# Patient Record
Sex: Female | Born: 1975 | Race: Black or African American | Hispanic: No | Marital: Single
Health system: Southern US, Academic
[De-identification: ages and names within clinical notes are randomized; demographics above are authoritative.]

## PROBLEM LIST (undated history)

## (undated) ENCOUNTER — Ambulatory Visit: Attending: Family | Primary: Family

## (undated) ENCOUNTER — Encounter

## (undated) ENCOUNTER — Ambulatory Visit

## (undated) ENCOUNTER — Non-Acute Institutional Stay: Payer: PRIVATE HEALTH INSURANCE | Attending: Dermatology | Primary: Dermatology

## (undated) ENCOUNTER — Encounter: Attending: Adult Health | Primary: Adult Health

## (undated) ENCOUNTER — Ambulatory Visit: Attending: Adult Health | Primary: Adult Health

## (undated) ENCOUNTER — Telehealth

## (undated) ENCOUNTER — Ambulatory Visit: Payer: PRIVATE HEALTH INSURANCE

## (undated) ENCOUNTER — Non-Acute Institutional Stay: Payer: PRIVATE HEALTH INSURANCE | Attending: Hematology & Oncology | Primary: Hematology & Oncology

## (undated) ENCOUNTER — Encounter: Payer: PRIVATE HEALTH INSURANCE | Attending: Hematology & Oncology | Primary: Hematology & Oncology

## (undated) DIAGNOSIS — L732 Hidradenitis suppurativa: Secondary | ICD-10-CM

## (undated) DIAGNOSIS — M47817 Spondylosis without myelopathy or radiculopathy, lumbosacral region: Secondary | ICD-10-CM

## (undated) DIAGNOSIS — M51369 Other intervertebral disc degeneration, lumbar region without mention of lumbar back pain or lower extremity pain: Secondary | ICD-10-CM

## (undated) DIAGNOSIS — D649 Anemia, unspecified: Secondary | ICD-10-CM

## (undated) DIAGNOSIS — E119 Type 2 diabetes mellitus without complications: Secondary | ICD-10-CM

## (undated) DIAGNOSIS — I10 Essential (primary) hypertension: Secondary | ICD-10-CM

## (undated) DIAGNOSIS — N920 Excessive and frequent menstruation with regular cycle: Secondary | ICD-10-CM

## (undated) HISTORY — DX: Anemia, unspecified: D64.9

## (undated) HISTORY — PX: ABDOMINAL SURGERY: SHX537

## (undated) HISTORY — PX: TUBAL LIGATION: SHX77

## (undated) HISTORY — DX: Type 2 diabetes mellitus without complications: E11.9

## (undated) HISTORY — PX: SMALL INTESTINE SURGERY: SHX150

## (undated) HISTORY — PX: OTHER SURGICAL HISTORY: SHX169

## (undated) HISTORY — PX: CHOLECYSTECTOMY: SHX55

---

## 2004-08-19 ENCOUNTER — Emergency Department: Payer: Self-pay | Admitting: Unknown Physician Specialty

## 2005-01-07 ENCOUNTER — Emergency Department: Payer: Self-pay | Admitting: Emergency Medicine

## 2005-02-26 ENCOUNTER — Emergency Department: Payer: Self-pay | Admitting: Emergency Medicine

## 2007-04-09 ENCOUNTER — Emergency Department: Payer: Self-pay | Admitting: Emergency Medicine

## 2007-10-12 ENCOUNTER — Emergency Department: Payer: Self-pay | Admitting: Emergency Medicine

## 2007-10-24 ENCOUNTER — Emergency Department: Payer: Self-pay | Admitting: Emergency Medicine

## 2007-11-19 ENCOUNTER — Ambulatory Visit: Payer: Self-pay

## 2007-11-22 ENCOUNTER — Ambulatory Visit: Payer: Self-pay

## 2008-04-16 ENCOUNTER — Observation Stay: Payer: Self-pay

## 2008-05-15 ENCOUNTER — Ambulatory Visit: Payer: Self-pay | Admitting: Obstetrics and Gynecology

## 2008-05-16 ENCOUNTER — Inpatient Hospital Stay: Payer: Self-pay | Admitting: Obstetrics and Gynecology

## 2009-01-15 ENCOUNTER — Emergency Department: Payer: Self-pay | Admitting: Emergency Medicine

## 2009-06-23 HISTORY — PX: BREAST CYST ASPIRATION: SHX578

## 2009-11-20 ENCOUNTER — Ambulatory Visit: Payer: Self-pay

## 2010-01-15 ENCOUNTER — Ambulatory Visit: Payer: Self-pay | Admitting: General Surgery

## 2010-01-16 ENCOUNTER — Ambulatory Visit: Payer: Self-pay | Admitting: General Surgery

## 2010-01-17 ENCOUNTER — Ambulatory Visit: Payer: Self-pay | Admitting: General Surgery

## 2010-02-28 ENCOUNTER — Emergency Department: Payer: Self-pay | Admitting: Emergency Medicine

## 2011-09-02 ENCOUNTER — Emergency Department: Payer: Self-pay | Admitting: Emergency Medicine

## 2011-09-05 LAB — BETA STREP CULTURE(ARMC)

## 2015-09-12 ENCOUNTER — Encounter: Payer: Self-pay | Admitting: Emergency Medicine

## 2015-09-12 ENCOUNTER — Emergency Department
Admission: EM | Admit: 2015-09-12 | Discharge: 2015-09-12 | Disposition: A | Discharge status: Home / Self Care | Payer: No Typology Code available for payment source | Attending: Emergency Medicine | Admitting: Emergency Medicine

## 2015-09-12 ENCOUNTER — Emergency Department: Payer: No Typology Code available for payment source

## 2015-09-12 DIAGNOSIS — M25512 Pain in left shoulder: Secondary | ICD-10-CM | POA: Diagnosis present

## 2015-09-12 DIAGNOSIS — M79605 Pain in left leg: Secondary | ICD-10-CM | POA: Insufficient documentation

## 2015-09-12 DIAGNOSIS — R0789 Other chest pain: Secondary | ICD-10-CM | POA: Diagnosis not present

## 2015-09-12 DIAGNOSIS — Y92410 Unspecified street and highway as the place of occurrence of the external cause: Secondary | ICD-10-CM | POA: Diagnosis not present

## 2015-09-12 DIAGNOSIS — Y9389 Activity, other specified: Secondary | ICD-10-CM | POA: Diagnosis not present

## 2015-09-12 DIAGNOSIS — Y999 Unspecified external cause status: Secondary | ICD-10-CM | POA: Insufficient documentation

## 2015-09-12 MED ORDER — IBUPROFEN 800 MG PO TABS: 800.0000 mg | ORAL_TABLET | Freq: Three times a day (TID) | ORAL | Status: DC | PRN

## 2015-09-12 MED ORDER — TRAMADOL HCL 50 MG PO TABS
50.0000 mg | ORAL_TABLET | Freq: Once | ORAL | Status: AC
Start: 2015-09-12 — End: 2015-09-12
  Administered 2015-09-12: 50 mg via ORAL
  Filled 2015-09-12: qty 1

## 2015-09-12 MED ORDER — METHOCARBAMOL 500 MG PO TABS
1000.0000 mg | ORAL_TABLET | Freq: Once | ORAL | Status: AC
  Administered 2015-09-12: 1000 mg via ORAL
  Filled 2015-09-12: qty 2

## 2015-09-12 MED ORDER — METHOCARBAMOL 750 MG PO TABS: 750.0000 mg | ORAL_TABLET | Freq: Four times a day (QID) | ORAL | Status: DC

## 2015-09-12 MED ORDER — IBUPROFEN 800 MG PO TABS
800.0000 mg | ORAL_TABLET | Freq: Once | ORAL | Status: AC
  Administered 2015-09-12: 800 mg via ORAL
  Filled 2015-09-12: qty 1

## 2015-09-12 MED ORDER — OXYCODONE-ACETAMINOPHEN 5-325 MG PO TABS: 1.0000 | ORAL_TABLET | Freq: Four times a day (QID) | ORAL | Status: DC | PRN
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
## 2015-09-12 NOTE — Discharge Instructions (Signed)

## 2015-09-13 ENCOUNTER — Emergency Department
Admission: EM | Admit: 2015-09-13 | Discharge: 2015-09-13 | Disposition: A | Discharge status: Home / Self Care | Payer: No Typology Code available for payment source | Attending: Emergency Medicine | Admitting: Emergency Medicine

## 2015-09-13 ENCOUNTER — Emergency Department: Payer: No Typology Code available for payment source

## 2015-09-13 ENCOUNTER — Encounter: Payer: Self-pay | Admitting: Emergency Medicine

## 2015-09-13 DIAGNOSIS — S161XXA Strain of muscle, fascia and tendon at neck level, initial encounter: Secondary | ICD-10-CM | POA: Diagnosis not present

## 2015-09-13 DIAGNOSIS — Y92410 Unspecified street and highway as the place of occurrence of the external cause: Secondary | ICD-10-CM | POA: Insufficient documentation

## 2015-09-13 DIAGNOSIS — Y9389 Activity, other specified: Secondary | ICD-10-CM | POA: Insufficient documentation

## 2015-09-13 DIAGNOSIS — L989 Disorder of the skin and subcutaneous tissue, unspecified: Secondary | ICD-10-CM

## 2015-09-13 DIAGNOSIS — M542 Cervicalgia: Secondary | ICD-10-CM | POA: Diagnosis present

## 2015-09-13 DIAGNOSIS — Y999 Unspecified external cause status: Secondary | ICD-10-CM | POA: Insufficient documentation

## 2015-09-13 MED ORDER — KETOROLAC TROMETHAMINE 60 MG/2ML IM SOLN
60.0000 mg | Freq: Once | INTRAMUSCULAR | Status: AC
  Administered 2015-09-13: 60 mg via INTRAMUSCULAR
  Filled 2015-09-13: qty 2

## 2015-09-13 MED ORDER — SULFAMETHOXAZOLE-TRIMETHOPRIM 800-160 MG PO TABS: 1.0000 | ORAL_TABLET | Freq: Two times a day (BID) | ORAL | Status: DC

## 2015-09-13 NOTE — Discharge Instructions (Signed)
Cervical Strain and Sprain With Rehab  Cervical strain and sprain are injuries that commonly occur with "whiplash" injuries. Whiplash occurs when the neck is forcefully whipped backward or forward, such as during a motor vehicle accident or during contact sports. The muscles, ligaments, tendons, discs, and nerves of the neck are susceptible to injury when this occurs.  RISK FACTORS  Risk of having a whiplash injury increases if:  · Osteoarthritis of the spine.  · Situations that make head or neck accidents or trauma more likely.  · High-risk sports (football, rugby, wrestling, hockey, auto racing, gymnastics, diving, contact karate, or boxing).  · Poor strength and flexibility of the neck.  · Previous neck injury.  · Poor tackling technique.  · Improperly fitted or padded equipment.  SYMPTOMS   · Pain or stiffness in the front or back of neck or both.  · Symptoms may present immediately or up to 24 hours after injury.  · Dizziness, headache, nausea, and vomiting.  · Muscle spasm with soreness and stiffness in the neck.  · Tenderness and swelling at the injury site.  PREVENTION  · Learn and use proper technique (avoid tackling with the head, spearing, and head-butting; use proper falling techniques to avoid landing on the head).  · Warm up and stretch properly before activity.  · Maintain physical fitness:    Strength, flexibility, and endurance.    Cardiovascular fitness.  · Wear properly fitted and padded protective equipment, such as padded soft collars, for participation in contact sports.  PROGNOSIS   Recovery from cervical strain and sprain injuries is dependent on the extent of the injury. These injuries are usually curable in 1 week to 3 months with appropriate treatment.   RELATED COMPLICATIONS   · Temporary numbness and weakness may occur if the nerve roots are damaged, and this may persist until the nerve has completely healed.  · Chronic pain due to frequent recurrence of symptoms.  · Prolonged healing,  especially if activity is resumed too soon (before complete recovery).  TREATMENT   Treatment initially involves the use of ice and medication to help reduce pain and inflammation. It is also important to perform strengthening and stretching exercises and modify activities that worsen symptoms so the injury does not get worse. These exercises may be performed at home or with a therapist. For patients who experience severe symptoms, a soft, padded collar may be recommended to be worn around the neck.   Improving your posture may help reduce symptoms. Posture improvement includes pulling your chin and abdomen in while sitting or standing. If you are sitting, sit in a firm chair with your buttocks against the back of the chair. While sleeping, try replacing your pillow with a small towel rolled to 2 inches in diameter, or use a cervical pillow or soft cervical collar. Poor sleeping positions delay healing.   For patients with nerve root damage, which causes numbness or weakness, the use of a cervical traction apparatus may be recommended. Surgery is rarely necessary for these injuries. However, cervical strain and sprains that are present at birth (congenital) may require surgery.  MEDICATION   · If pain medication is necessary, nonsteroidal anti-inflammatory medications, such as aspirin and ibuprofen, or other minor pain relievers, such as acetaminophen, are often recommended.  · Do not take pain medication for 7 days before surgery.  · Prescription pain relievers may be given if deemed necessary by your caregiver. Use only as directed and only as much as you need.    HEAT AND COLD:   · Cold treatment (icing) relieves pain and reduces inflammation. Cold treatment should be applied for 10 to 15 minutes every 2 to 3 hours for inflammation and pain and immediately after any activity that aggravates your symptoms. Use ice packs or an ice massage.  · Heat treatment may be used prior to performing the stretching and  strengthening activities prescribed by your caregiver, physical therapist, or athletic trainer. Use a heat pack or a warm soak.  SEEK MEDICAL CARE IF:   · Symptoms get worse or do not improve in 2 weeks despite treatment.  · New, unexplained symptoms develop (drugs used in treatment may produce side effects).  EXERCISES  RANGE OF MOTION (ROM) AND STRETCHING EXERCISES - Cervical Strain and Sprain  These exercises may help you when beginning to rehabilitate your injury. In order to successfully resolve your symptoms, you must improve your posture. These exercises are designed to help reduce the forward-head and rounded-shoulder posture which contributes to this condition. Your symptoms may resolve with or without further involvement from your physician, physical therapist or athletic trainer. While completing these exercises, remember:   · Restoring tissue flexibility helps normal motion to return to the joints. This allows healthier, less painful movement and activity.  · An effective stretch should be held for at least 20 seconds, although you may need to begin with shorter hold times for comfort.  · A stretch should never be painful. You should only feel a gentle lengthening or release in the stretched tissue.  STRETCH- Axial Extensors  · Lie on your back on the floor. You may bend your knees for comfort. Place a rolled-up hand towel or dish towel, about 2 inches in diameter, under the part of your head that makes contact with the floor.  · Gently tuck your chin, as if trying to make a "double chin," until you feel a gentle stretch at the base of your head.  · Hold __________ seconds.  Repeat __________ times. Complete this exercise __________ times per day.   STRETCH - Axial Extension   · Stand or sit on a firm surface. Assume a good posture: chest up, shoulders drawn back, abdominal muscles slightly tense, knees unlocked (if standing) and feet hip width apart.  · Slowly retract your chin so your head slides back  and your chin slightly lowers. Continue to look straight ahead.  · You should feel a gentle stretch in the back of your head. Be certain not to feel an aggressive stretch since this can cause headaches later.  · Hold for __________ seconds.  Repeat __________ times. Complete this exercise __________ times per day.  STRETCH - Cervical Side Bend   · Stand or sit on a firm surface. Assume a good posture: chest up, shoulders drawn back, abdominal muscles slightly tense, knees unlocked (if standing) and feet hip width apart.  · Without letting your nose or shoulders move, slowly tip your right / left ear to your shoulder until your feel a gentle stretch in the muscles on the opposite side of your neck.  · Hold __________ seconds.  Repeat __________ times. Complete this exercise __________ times per day.  STRETCH - Cervical Rotators   · Stand or sit on a firm surface. Assume a good posture: chest up, shoulders drawn back, abdominal muscles slightly tense, knees unlocked (if standing) and feet hip width apart.  · Keeping your eyes level with the ground, slowly turn your head until you feel a gentle stretch along   the back and opposite side of your neck.  · Hold __________ seconds.  Repeat __________ times. Complete this exercise __________ times per day.  RANGE OF MOTION - Neck Circles   · Stand or sit on a firm surface. Assume a good posture: chest up, shoulders drawn back, abdominal muscles slightly tense, knees unlocked (if standing) and feet hip width apart.  · Gently roll your head down and around from the back of one shoulder to the back of the other. The motion should never be forced or painful.  · Repeat the motion 10-20 times, or until you feel the neck muscles relax and loosen.  Repeat __________ times. Complete the exercise __________ times per day.  STRENGTHENING EXERCISES - Cervical Strain and Sprain  These exercises may help you when beginning to rehabilitate your injury. They may resolve your symptoms with or  without further involvement from your physician, physical therapist, or athletic trainer. While completing these exercises, remember:   · Muscles can gain both the endurance and the strength needed for everyday activities through controlled exercises.  · Complete these exercises as instructed by your physician, physical therapist, or athletic trainer. Progress the resistance and repetitions only as guided.  · You may experience muscle soreness or fatigue, but the pain or discomfort you are trying to eliminate should never worsen during these exercises. If this pain does worsen, stop and make certain you are following the directions exactly. If the pain is still present after adjustments, discontinue the exercise until you can discuss the trouble with your clinician.  STRENGTH - Cervical Flexors, Isometric  · Face a wall, standing about 6 inches away. Place a small pillow, a ball about 6-8 inches in diameter, or a folded towel between your forehead and the wall.  · Slightly tuck your chin and gently push your forehead into the soft object. Push only with mild to moderate intensity, building up tension gradually. Keep your jaw and forehead relaxed.  · Hold 10 to 20 seconds. Keep your breathing relaxed.  · Release the tension slowly. Relax your neck muscles completely before you start the next repetition.  Repeat __________ times. Complete this exercise __________ times per day.  STRENGTH- Cervical Lateral Flexors, Isometric   · Stand about 6 inches away from a wall. Place a small pillow, a ball about 6-8 inches in diameter, or a folded towel between the side of your head and the wall.  · Slightly tuck your chin and gently tilt your head into the soft object. Push only with mild to moderate intensity, building up tension gradually. Keep your jaw and forehead relaxed.  · Hold 10 to 20 seconds. Keep your breathing relaxed.  · Release the tension slowly. Relax your neck muscles completely before you start the next  repetition.  Repeat __________ times. Complete this exercise __________ times per day.  STRENGTH - Cervical Extensors, Isometric   · Stand about 6 inches away from a wall. Place a small pillow, a ball about 6-8 inches in diameter, or a folded towel between the back of your head and the wall.  · Slightly tuck your chin and gently tilt your head back into the soft object. Push only with mild to moderate intensity, building up tension gradually. Keep your jaw and forehead relaxed.  · Hold 10 to 20 seconds. Keep your breathing relaxed.  · Release the tension slowly. Relax your neck muscles completely before you start the next repetition.  Repeat __________ times. Complete this exercise __________ times per day.    All of your joints have less wear and tear when properly supported by a spine with good posture. This means you will experience a healthier, less painful body.  Correct posture must be practiced with all of your activities, especially prolonged sitting and standing. Correct posture is as important when doing repetitive low-stress activities (typing) as it is when doing a single heavy-load activity (lifting). PROLONGED STANDING WHILE SLIGHTLY LEANING FORWARD When completing a task that requires you to lean forward while standing in one  place for a long time, place either foot up on a stationary 2- to 4-inch high object to help maintain the best posture. When both feet are on the ground, the low back tends to lose its slight inward curve. If this curve flattens (or becomes too large), then the back and your other joints will experience too much stress, fatigue more quickly, and can cause pain.  RESTING POSITIONS Consider which positions are most painful for you when choosing a resting position. If you have pain with flexion-based activities (sitting, bending, stooping, squatting), choose a position that allows you to rest in a less flexed posture. You would want to avoid curling into a fetal position on your side. If your pain worsens with extension-based activities (prolonged standing, working overhead), avoid resting in an extended position such as sleeping on your stomach. Most people will find more comfort when they rest with their spine in a more neutral position, neither too rounded nor too arched. Lying on a non-sagging bed on your side with a pillow between your knees, or on your back with a pillow under your knees will often provide some relief. Keep in mind, being in any one position for a prolonged period of time, no matter how correct your posture, can still lead to stiffness. WALKING Walk with an upright posture. Your ears, shoulders, and hips should all line up. OFFICE WORK When working at a desk, create an environment that supports good, upright posture. Without extra support, muscles fatigue and lead to excessive strain on joints and other tissues. CHAIR:  A chair should be able to slide under your desk when your back makes contact with the back of the chair. This allows you to work closely.  The chair's height should allow your eyes to be level with the upper part of your monitor and your hands to be slightly lower than your elbows.  Body position:  Your feet should make contact with the floor. If this is not  possible, use a foot rest.  Keep your ears over your shoulders. This will reduce stress on your neck and low back.   This information is not intended to replace advice given to you by your health care provider. Make sure you discuss any questions you have with your health care provider.   Document Released: 06/09/2005 Document Revised: 06/30/2014 Document Reviewed: 09/21/2008 [employer] Interactive Patient Education 2016 [employer]  Continue ibuprofen, muscle relaxants and Percocet for pain control. Begin exercises when pain improves. Apply warm compresses to area on scalp. Take antibiotic as directed. Return to the emergency room for any worsening symptoms.
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
## 2015-09-13 NOTE — ED Notes (Signed)
Pt discharged to home.  Family member driving.  Discharge instructions reviewed.  Verbalized understanding.  No questions or concerns at this time.  Teach back verified.  Pt in NAD.  No items left in ED.   

## 2016-08-05 DIAGNOSIS — M5136 Other intervertebral disc degeneration, lumbar region: Secondary | ICD-10-CM | POA: Insufficient documentation

## 2016-08-05 DIAGNOSIS — L732 Hidradenitis suppurativa: Secondary | ICD-10-CM | POA: Insufficient documentation

## 2016-08-05 DIAGNOSIS — M47817 Spondylosis without myelopathy or radiculopathy, lumbosacral region: Secondary | ICD-10-CM | POA: Insufficient documentation

## 2016-08-05 DIAGNOSIS — M51369 Other intervertebral disc degeneration, lumbar region without mention of lumbar back pain or lower extremity pain: Secondary | ICD-10-CM | POA: Insufficient documentation

## 2016-08-05 DIAGNOSIS — M25532 Pain in left wrist: Secondary | ICD-10-CM | POA: Insufficient documentation

## 2016-08-13 ENCOUNTER — Other Ambulatory Visit: Payer: Self-pay | Admitting: Orthopedic Surgery

## 2016-08-13 DIAGNOSIS — S63502A Unspecified sprain of left wrist, initial encounter: Secondary | ICD-10-CM

## 2016-08-13 DIAGNOSIS — M25532 Pain in left wrist: Secondary | ICD-10-CM

## 2016-08-13 DIAGNOSIS — G8929 Other chronic pain: Secondary | ICD-10-CM

## 2016-08-19 ENCOUNTER — Ambulatory Visit: Payer: BLUE CROSS/BLUE SHIELD

## 2016-11-19 ENCOUNTER — Encounter: Payer: Self-pay | Admitting: Emergency Medicine

## 2016-11-19 ENCOUNTER — Emergency Department
Admission: EM | Admit: 2016-11-19 | Discharge: 2016-11-19 | Disposition: A | Discharge status: Home / Self Care | Payer: BLUE CROSS/BLUE SHIELD | Attending: Emergency Medicine | Admitting: Emergency Medicine

## 2016-11-19 DIAGNOSIS — R1030 Lower abdominal pain, unspecified: Secondary | ICD-10-CM

## 2016-11-19 DIAGNOSIS — R1032 Left lower quadrant pain: Secondary | ICD-10-CM | POA: Insufficient documentation

## 2016-11-19 LAB — URINALYSIS, COMPLETE (UACMP) WITH MICROSCOPIC
Bacteria, UA: NONE SEEN
Bilirubin Urine: NEGATIVE
GLUCOSE, UA: NEGATIVE mg/dL
Ketones, ur: NEGATIVE mg/dL
Leukocytes, UA: NEGATIVE
Nitrite: NEGATIVE
PH: 6 (ref 5.0–8.0)
PROTEIN: NEGATIVE mg/dL
Specific Gravity, Urine: 1.016 (ref 1.005–1.030)

## 2016-11-19 LAB — COMPREHENSIVE METABOLIC PANEL
ALBUMIN: 3.9 g/dL (ref 3.5–5.0)
ALK PHOS: 111 U/L (ref 38–126)
ALT: 35 U/L (ref 14–54)
AST: 27 U/L (ref 15–41)
Anion gap: 7 (ref 5–15)
BUN: 10 mg/dL (ref 6–20)
CALCIUM: 8.9 mg/dL (ref 8.9–10.3)
CO2: 26 mmol/L (ref 22–32)
CREATININE: 0.77 mg/dL (ref 0.44–1.00)
Chloride: 105 mmol/L (ref 101–111)
GFR calc Af Amer: >60 mL/min (ref >60)
GFR calc non Af Amer: >60 mL/min (ref >60)
GLUCOSE: 145 mg/dL — AB (ref 65–99)
Potassium: 3.4 mmol/L — ABNORMAL LOW (ref 3.5–5.1)
SODIUM: 138 mmol/L (ref 135–145)
Total Bilirubin: 0.5 mg/dL (ref 0.3–1.2)
Total Protein: 8.7 g/dL — ABNORMAL HIGH (ref 6.5–8.1)

## 2016-11-19 LAB — CBC
HCT: 27.9 % — ABNORMAL LOW (ref 35.0–47.0)
Hemoglobin: 8.4 g/dL — ABNORMAL LOW (ref 12.0–16.0)
MCH: 17.2 pg — AB (ref 26.0–34.0)
MCHC: 30 g/dL — AB (ref 32.0–36.0)
MCV: 57.4 fL — ABNORMAL LOW (ref 80.0–100.0)
PLATELETS: 387 10*3/uL (ref 150–440)
RBC: 4.86 MIL/uL (ref 3.80–5.20)
RDW: 20.5 % — ABNORMAL HIGH (ref 11.5–14.5)
WBC: 9.6 10*3/uL (ref 3.6–11.0)

## 2016-11-19 LAB — LIPASE, BLOOD: Lipase: 35 U/L (ref 11–51)

## 2016-11-19 LAB — POCT PREGNANCY, URINE: Preg Test, Ur: NEGATIVE

## 2016-11-19 NOTE — ED Triage Notes (Signed)
Patient presents to the ED with left lower quadrant pain x 1 week.  Patient states her menstrual period is about 1-2 weeks late.  Patient has history of tubal ligation.  Patient is in no obvious distress at this time.  Denies any vaginal bleeding.  Denies any flank pain.

## 2016-11-19 NOTE — ED Provider Notes (Signed)
[facility] Emergency Department Provider Note  Time seen: 1:14 PM  I have reviewed the triage vital signs and the nursing notes.   HISTORY  Chief Complaint Abdominal Pain    HPI Shannon Escobar is a 41 y.o. female who presents to the emergency department with 1 week of intermittent lower abdominal cramping and being approximately 2 weeks late on her period. According to the patient for the past one week she has had mild left lower quadrant abdominal cramping. She states it felt like menstrual cramping better. Never started. States her periods are normally regular and it is very abnormal for her to go 6 weeks without a period. Denies any nausea, vomiting, diarrhea, hematuria or dysuria. Denies any vaginal discharge. Denies any fever. Denies any history of colitis, diverticulitis or ovarian cysts.  History reviewed. No pertinent past medical history.  There are no active problems to display for this patient.   Past Surgical History:  Procedure Laterality Date  . ABDOMINAL SURGERY    . CESAREAN SECTION     x2  . CHOLECYSTECTOMY    . TUBAL LIGATION      Prior to Admission medications   Medication Sig Start Date End Date Taking? Authorizing Provider  ibuprofen (ADVIL,MOTRIN) 800 MG tablet Take 1 tablet (800 mg total) by mouth every 8 (eight) hours as needed for moderate pain. 09/12/15   Joni Reining, PA-C  methocarbamol (ROBAXIN-750) 750 MG tablet Take 1 tablet (750 mg total) by mouth 4 (four) times daily. 09/12/15   Joni Reining, PA-C  oxyCODONE-acetaminophen (ROXICET) 5-325 MG tablet Take 1 tablet by mouth every 6 (six) hours as needed for moderate pain. 09/12/15   Joni Reining, PA-C  sulfamethoxazole-trimethoprim (BACTRIM DS,SEPTRA DS) 800-160 MG tablet Take 1 tablet by mouth 2 (two) times daily. 09/13/15   Ignacia Bayley, PA-C    Allergies  Allergen Reactions  . Aspirin   . Penicillins     No family history on file.  Social History Social  History  Substance Use Topics  . Smoking status: Never Smoker  . Smokeless tobacco: Never Used  . Alcohol use No    Review of Systems Constitutional: Negative for fever. Cardiovascular: Negative for chest pain. Respiratory: Negative for shortness of breath. Gastrointestinal: Left lower quadrant abdominal pain. Negative for nausea vomiting or diarrhea. Genitourinary: Negative for dysuria. Negative for vaginal discharge or bleeding. Musculoskeletal: Negative for back pain. Neurological: Negative for headache All other ROS negative  ____________________________________________   PHYSICAL EXAM:  VITAL SIGNS: ED Triage Vitals  Enc Vitals Group     BP 11/19/16 1117 (!) 142/82     Pulse Rate 11/19/16 1117 91     Resp 11/19/16 1117 15     Temp 11/19/16 1117 98.8 F (37.1 C)     Temp Source 11/19/16 1117 Oral     SpO2 11/19/16 1117 98 %     Weight 11/19/16 1118 220 lb (99.8 kg)     Height 11/19/16 1118 5\' 7"  (1.702 m)     Head Circumference --      Peak Flow --      Pain Score 11/19/16 1121 8     Pain Loc --      Pain Edu? --      Excl. in GC? --     Constitutional: Alert and oriented. Well appearing and in no distress. Eyes: Normal exam ENT   Head: Normocephalic and atraumatic.   Mouth/Throat: Mucous membranes are moist. Cardiovascular: Normal rate, regular  rhythm. No murmur Respiratory: Normal respiratory effort without tachypnea nor retractions. Breath sounds are clear  Gastrointestinal: Soft, slight left lower quadrant tenderness palpation, no reaction to palpation. No rebound or guarding. No distention. Abdomen otherwise benign. Musculoskeletal: Nontender with normal range of motion in all extremities.  Neurologic:  Normal speech and language. No gross focal neurologic deficits  Skin:  Skin is warm, dry and intact.  Psychiatric: Mood and affect are normal.   ____________________________________________   INITIAL IMPRESSION / ASSESSMENT AND PLAN / ED  COURSE  Pertinent labs & imaging results that were available during my care of the patient were reviewed by me and considered in my medical decision making (see chart for details).  The patient presents to the emergency department with mild left lower quadrant abdominal cramping for the past one week. Patient states the pain goes away when she takes ibuprofen. She states her main concern is not having a period over the past 6 weeks and making sure she is not pregnant although she states a tubal ligation 8 years ago. Patient's labs are normal including a negative pregnancy test. Normal white blood cell count. I discussed the options with the patient including going ahead and getting a CT scan done today to rule out colitis/diverticulitis. Patient states the discomfort is very minimal and she would prefer to wait to see if the discomfort resolves on its own. Does not want any pain medication, states ibuprofen has been sufficient for the cramping. With a normal workup will discharge the patient home with primary care follow-up. I discussed my normal abdominal pain return precautions with the patient.  ____________________________________________   FINAL CLINICAL IMPRESSION(S) / ED DIAGNOSES  Lower abdominal pain    Minna AntisPaduchowski, Edith Groleau, MD 11/19/16 1325

## 2016-11-19 NOTE — Discharge Instructions (Signed)
As we discussed please follow-up with your primary care doctor in the next several days for recheck/reevaluation. Return to the emergency department for any worsening abdominal pain, diarrhea or fever.

## 2017-02-11 ENCOUNTER — Other Ambulatory Visit: Payer: Self-pay | Admitting: Medical Oncology

## 2017-02-11 DIAGNOSIS — N63 Unspecified lump in unspecified breast: Secondary | ICD-10-CM

## 2017-02-18 ENCOUNTER — Inpatient Hospital Stay
Admission: RE | Admit: 2017-02-18 | Discharge: 2017-02-18 | Disposition: A | Discharge status: Home / Self Care | Payer: Self-pay | Source: Ambulatory Visit | Attending: *Deleted | Admitting: *Deleted

## 2017-02-18 ENCOUNTER — Other Ambulatory Visit: Payer: Self-pay | Admitting: *Deleted

## 2017-02-18 DIAGNOSIS — Z1231 Encounter for screening mammogram for malignant neoplasm of breast: Secondary | ICD-10-CM

## 2017-02-20 ENCOUNTER — Other Ambulatory Visit: Payer: Self-pay | Admitting: Medical Oncology

## 2017-02-20 ENCOUNTER — Ambulatory Visit
Admission: RE | Admit: 2017-02-20 | Discharge: 2017-02-20 | Disposition: A | Discharge status: Home / Self Care | Payer: Self-pay | Source: Ambulatory Visit | Attending: Medical Oncology | Admitting: Medical Oncology

## 2017-02-20 DIAGNOSIS — N63 Unspecified lump in unspecified breast: Secondary | ICD-10-CM

## 2017-02-20 DIAGNOSIS — N611 Abscess of the breast and nipple: Secondary | ICD-10-CM

## 2017-02-23 LAB — BODY FLUID CULTURE: Culture: NO GROWTH

## 2017-02-24 LAB — CYTOLOGY - NON PAP

## 2017-02-27 ENCOUNTER — Other Ambulatory Visit: Payer: Self-pay | Admitting: Medical Oncology

## 2017-02-27 ENCOUNTER — Ambulatory Visit
Admission: RE | Admit: 2017-02-27 | Discharge: 2017-02-27 | Disposition: A | Discharge status: Home / Self Care | Payer: PRIVATE HEALTH INSURANCE | Source: Ambulatory Visit | Attending: Medical Oncology | Admitting: Medical Oncology

## 2017-02-27 DIAGNOSIS — N611 Abscess of the breast and nipple: Secondary | ICD-10-CM | POA: Diagnosis present

## 2017-02-27 DIAGNOSIS — R928 Other abnormal and inconclusive findings on diagnostic imaging of breast: Secondary | ICD-10-CM

## 2017-03-06 ENCOUNTER — Ambulatory Visit
Admission: RE | Admit: 2017-03-06 | Discharge: 2017-03-06 | Disposition: A | Discharge status: Home / Self Care | Payer: No Typology Code available for payment source | Source: Ambulatory Visit | Attending: Medical Oncology | Admitting: Medical Oncology

## 2017-03-06 DIAGNOSIS — N611 Abscess of the breast and nipple: Secondary | ICD-10-CM | POA: Diagnosis not present

## 2017-03-06 DIAGNOSIS — R928 Other abnormal and inconclusive findings on diagnostic imaging of breast: Secondary | ICD-10-CM

## 2017-03-11 ENCOUNTER — Other Ambulatory Visit: Payer: Self-pay | Admitting: Medical Oncology

## 2017-03-11 DIAGNOSIS — N611 Abscess of the breast and nipple: Secondary | ICD-10-CM

## 2017-03-11 DIAGNOSIS — R928 Other abnormal and inconclusive findings on diagnostic imaging of breast: Secondary | ICD-10-CM

## 2017-03-13 ENCOUNTER — Ambulatory Visit
Admission: RE | Admit: 2017-03-13 | Discharge: 2017-03-13 | Disposition: A | Discharge status: Home / Self Care | Payer: No Typology Code available for payment source | Source: Ambulatory Visit | Attending: Medical Oncology | Admitting: Medical Oncology

## 2017-03-13 ENCOUNTER — Other Ambulatory Visit: Payer: Self-pay | Admitting: *Deleted

## 2017-03-13 ENCOUNTER — Ambulatory Visit
Admission: RE | Admit: 2017-03-13 | Discharge: 2017-03-13 | Disposition: A | Discharge status: Home / Self Care | Payer: Self-pay | Source: Ambulatory Visit | Attending: *Deleted | Admitting: *Deleted

## 2017-03-13 DIAGNOSIS — N611 Abscess of the breast and nipple: Secondary | ICD-10-CM

## 2017-03-13 DIAGNOSIS — Z9889 Other specified postprocedural states: Secondary | ICD-10-CM | POA: Diagnosis not present

## 2017-03-13 DIAGNOSIS — R928 Other abnormal and inconclusive findings on diagnostic imaging of breast: Secondary | ICD-10-CM

## 2017-03-13 DIAGNOSIS — Z9289 Personal history of other medical treatment: Secondary | ICD-10-CM

## 2017-03-18 ENCOUNTER — Other Ambulatory Visit: Payer: Self-pay | Admitting: Medical Oncology

## 2017-03-18 DIAGNOSIS — N611 Abscess of the breast and nipple: Secondary | ICD-10-CM

## 2017-03-18 DIAGNOSIS — R928 Other abnormal and inconclusive findings on diagnostic imaging of breast: Secondary | ICD-10-CM

## 2017-03-20 ENCOUNTER — Other Ambulatory Visit: Payer: BLUE CROSS/BLUE SHIELD

## 2017-03-20 ENCOUNTER — Ambulatory Visit
Admission: RE | Admit: 2017-03-20 | Discharge: 2017-03-20 | Disposition: A | Discharge status: Home / Self Care | Payer: No Typology Code available for payment source | Source: Ambulatory Visit | Attending: Medical Oncology | Admitting: Medical Oncology

## 2017-03-20 DIAGNOSIS — N611 Abscess of the breast and nipple: Secondary | ICD-10-CM | POA: Diagnosis not present

## 2017-03-20 DIAGNOSIS — R928 Other abnormal and inconclusive findings on diagnostic imaging of breast: Secondary | ICD-10-CM

## 2017-03-25 ENCOUNTER — Other Ambulatory Visit: Payer: Self-pay | Admitting: Medical Oncology

## 2017-03-25 DIAGNOSIS — R928 Other abnormal and inconclusive findings on diagnostic imaging of breast: Secondary | ICD-10-CM

## 2017-03-25 DIAGNOSIS — N611 Abscess of the breast and nipple: Secondary | ICD-10-CM

## 2017-04-03 ENCOUNTER — Ambulatory Visit: Payer: No Typology Code available for payment source

## 2017-04-20 ENCOUNTER — Emergency Department: Payer: Self-pay

## 2017-04-20 ENCOUNTER — Encounter: Payer: Self-pay | Admitting: Emergency Medicine

## 2017-04-20 ENCOUNTER — Emergency Department
Admission: EM | Admit: 2017-04-20 | Discharge: 2017-04-20 | Disposition: A | Discharge status: Home / Self Care | Payer: Self-pay | Attending: Emergency Medicine | Admitting: Emergency Medicine

## 2017-04-20 DIAGNOSIS — L03031 Cellulitis of right toe: Secondary | ICD-10-CM | POA: Insufficient documentation

## 2017-04-20 DIAGNOSIS — Y939 Activity, unspecified: Secondary | ICD-10-CM | POA: Insufficient documentation

## 2017-04-20 DIAGNOSIS — Y929 Unspecified place or not applicable: Secondary | ICD-10-CM | POA: Insufficient documentation

## 2017-04-20 DIAGNOSIS — Y999 Unspecified external cause status: Secondary | ICD-10-CM | POA: Insufficient documentation

## 2017-04-20 DIAGNOSIS — Z79899 Other long term (current) drug therapy: Secondary | ICD-10-CM | POA: Insufficient documentation

## 2017-04-20 DIAGNOSIS — W19XXXA Unspecified fall, initial encounter: Secondary | ICD-10-CM | POA: Insufficient documentation

## 2017-04-20 DIAGNOSIS — S90111A Contusion of right great toe without damage to nail, initial encounter: Secondary | ICD-10-CM | POA: Insufficient documentation

## 2017-04-20 MED ORDER — TRAMADOL HCL 50 MG PO TABS: 50.0000 mg | ORAL_TABLET | Freq: Four times a day (QID) | ORAL | 0 refills | Status: AC | PRN

## 2017-04-20 MED ORDER — CEPHALEXIN 500 MG PO CAPS: 500.0000 mg | ORAL_CAPSULE | Freq: Two times a day (BID) | ORAL | 0 refills | Status: AC

## 2017-04-20 MED ORDER — CEPHALEXIN 500 MG PO CAPS
500.0000 mg | ORAL_CAPSULE | Freq: Once | ORAL | Status: AC
  Administered 2017-04-20: 500 mg via ORAL
  Filled 2017-04-20: qty 1

## 2017-04-20 NOTE — ED Triage Notes (Signed)
Toe injury x1 day , great toe & 4th digit , drsg intact

## 2017-04-20 NOTE — ED Notes (Signed)
Pt to ed with c/o right foot first and fourth toe pain. Pt states she tripped on steps.  First toe with bleeding and abrasion, fourth toe bluish in color and swollen.  Pt reports increased pain with ambulation.  Pt reports last tetanus was 2 months ago.

## 2017-04-20 NOTE — ED Provider Notes (Signed)
[facility] Emergency Department Provider Note   First MD Initiated Contact with Patient 04/20/17 0915     (approximate)  I have reviewed the triage vital signs and the nursing notes.   HISTORY  Chief Complaint Toe Injury    HPI Gwenith Spitziffany L Aundria RudRogers is a 41 y.o. female resents with 8 out of 10 right great toe and fourth toe pain and abrasions status post accidental fall yesterday. Patient concerned about possible infection that she is noted what appears to be. No drainage from both toes since the injury. Patient states that she was going down her steps at her home where she actually had a fall resulting in injury.    Past medical history None There are no active problems to display for this patient.   Past Surgical History:  Procedure Laterality Date  . ABDOMINAL SURGERY    . BREAST CYST ASPIRATION Right 2011   abcess drained  . CESAREAN SECTION     x2  . CHOLECYSTECTOMY    . TUBAL LIGATION      Prior to Admission medications   Medication Sig Start Date End Date Taking? Authorizing Provider  cephALEXin (KEFLEX) 500 MG capsule Take 1 capsule (500 mg total) by mouth 2 (two) times daily. 04/20/17 04/30/17  Darci CurrentBrown, [place] N, MD  ibuprofen (ADVIL,MOTRIN) 800 MG tablet Take 1 tablet (800 mg total) by mouth every 8 (eight) hours as needed for moderate pain. 09/12/15   Joni ReiningSmith, Ronald K, PA-C  methocarbamol (ROBAXIN-750) 750 MG tablet Take 1 tablet (750 mg total) by mouth 4 (four) times daily. 09/12/15   Joni ReiningSmith, Ronald K, PA-C  oxyCODONE-acetaminophen (ROXICET) 5-325 MG tablet Take 1 tablet by mouth every 6 (six) hours as needed for moderate pain. 09/12/15   Joni ReiningSmith, Ronald K, PA-C  sulfamethoxazole-trimethoprim (BACTRIM DS,SEPTRA DS) 800-160 MG tablet Take 1 tablet by mouth 2 (two) times daily. 09/13/15   Ignacia Bayleyumey, Robert, PA-C  traMADol (ULTRAM) 50 MG tablet Take 1 tablet (50 mg total) by mouth every 6 (six) hours as needed. 04/20/17 04/20/18  Darci CurrentBrown, [place] N, MD     Allergies Aspirin and Penicillins  No family history on file.  Social History Social History  Substance Use Topics  . Smoking status: Never Smoker  . Smokeless tobacco: Never Used  . Alcohol use No    Review of Systems Constitutional: No fever/chills Eyes: No visual changes. ENT: No sore throat. Cardiovascular: Denies chest pain. Respiratory: Denies shortness of breath. Gastrointestinal: No abdominal pain.  No nausea, no vomiting.  No diarrhea.  No constipation. Genitourinary: Negative for dysuria. Musculoskeletal: Negative for neck pain.  Negative for back pain. Positive for right great toe and fourth toe pain Integumentary: Negative for rash. Neurological: Negative for headaches, focal weakness or numbness.  ____________________________________________   PHYSICAL EXAM:  VITAL SIGNS: ED Triage Vitals  Enc Vitals Group     BP 04/20/17 0839 (!) 140/92     Pulse Rate 04/20/17 0839 81     Resp 04/20/17 0839 18     Temp 04/20/17 0839 98.6 F (37 C)     Temp Source 04/20/17 0839 Oral     SpO2 04/20/17 0839 100 %     Weight 04/20/17 0833 104.3 kg (230 lb)     Height 04/20/17 0833 1.702 m (5\' 7" )     Head Circumference --      Peak Flow --      Pain Score 04/20/17 0832 8     Pain Loc --  Pain Edu? --      Excl. in GC? --     Constitutional: Alert and oriented. Well appearing and in no acute distress. Eyes: Conjunctivae are normal. Head: Atraumatic. Mouth/Throat: Mucous membranes are moist. Oropharynx non-erythematous. Neck: No stridor.   Musculoskeletal: Abrasion with surrounding ecchymoses, erythema and scant purulent drainage noted to the right great toe and right fourth toe. Neurologic:  Normal speech and language. No gross focal neurologic deficits are appreciated.  Skin:  Abrasion with surrounding ecchymoses and erythema with scant purulent drainage noted at the right great and fourth toe    RADIOLOGY I, Lawton N BROWN, personally viewed and  evaluated these images (plain radiographs) as part of my medical decision making, as well as reviewing the written report by the radiologist.  Dg Foot Complete Right  Result Date: 04/20/2017 CLINICAL DATA:  Initial encounter for Patient fell onto bricks and damaged right foot, visibly bruised, torn skin. EXAM: RIGHT FOOT COMPLETE - 3+ VIEW COMPARISON:  None. FINDINGS: No acute fracture or dislocation. No radiopaque foreign object. Small calcaneal spur. IMPRESSION: No acute osseous abnormality. Electronically Signed   By: Jeronimo Greaves M.D.   On: 04/20/2017 10:28     Procedures   ____________________________________________   INITIAL IMPRESSION / ASSESSMENT AND PLAN / ED COURSE  As part of my medical decision making, I reviewed the following data within the electronic MEDICAL RECORD NUMBER 41 year old female presenting with above stated history of physical exam. X-ray performed to evaluate for possible fracture of the great and fourth toe on the right foot. No no fracture or dislocation noted. Concern for possible evolving cellulitis and a such patient given Keflex 500 mg in the emergency department will be prescribed same for home. Spoke with patient at length regarding warning signs of worsening infection that would warrant return to the emergency department.     ____________________________________________  FINAL CLINICAL IMPRESSION(S) / ED DIAGNOSES  Final diagnoses:  Contusion of right great toe without damage to nail, initial encounter  Cellulitis of toe of right foot     MEDICATIONS GIVEN DURING THIS VISIT:  Medications  cephALEXin (KEFLEX) capsule 500 mg (500 mg Oral Given 04/20/17 0959)     NEW OUTPATIENT MEDICATIONS STARTED DURING THIS VISIT:  Discharge Medication List as of 04/20/2017 10:43 AM    START taking these medications   Details  cephALEXin (KEFLEX) 500 MG capsule Take 1 capsule (500 mg total) by mouth 2 (two) times daily., Starting Mon 04/20/2017, Until Thu  04/30/2017, Print    traMADol (ULTRAM) 50 MG tablet Take 1 tablet (50 mg total) by mouth every 6 (six) hours as needed., Starting Mon 04/20/2017, Until Tue 04/20/2018, Print        Discharge Medication List as of 04/20/2017 10:43 AM      Discharge Medication List as of 04/20/2017 10:43 AM       Note:  This document was prepared using Dragon voice recognition software and may include unintentional dictation errors.    Darci Current, MD 04/20/17 1126

## 2017-04-22 ENCOUNTER — Other Ambulatory Visit: Payer: PRIVATE HEALTH INSURANCE

## 2017-04-28 ENCOUNTER — Other Ambulatory Visit: Payer: PRIVATE HEALTH INSURANCE

## 2017-05-05 ENCOUNTER — Other Ambulatory Visit: Payer: PRIVATE HEALTH INSURANCE

## 2017-11-17 ENCOUNTER — Encounter: Admit: 2017-11-17 | Discharge: 2017-11-18 | Payer: PRIVATE HEALTH INSURANCE | Attending: Family | Primary: Family

## 2017-11-17 DIAGNOSIS — Z8632 Personal history of gestational diabetes: Secondary | ICD-10-CM

## 2017-11-17 DIAGNOSIS — L732 Hidradenitis suppurativa: Secondary | ICD-10-CM

## 2017-11-17 DIAGNOSIS — N6002 Solitary cyst of left breast: Principal | ICD-10-CM

## 2017-11-17 DIAGNOSIS — Z131 Encounter for screening for diabetes mellitus: Secondary | ICD-10-CM

## 2017-11-17 DIAGNOSIS — N63 Unspecified lump in unspecified breast: Secondary | ICD-10-CM

## 2017-11-24 ENCOUNTER — Encounter: Admit: 2017-11-24 | Discharge: 2017-11-25 | Payer: PRIVATE HEALTH INSURANCE

## 2017-11-24 ENCOUNTER — Ambulatory Visit: Admit: 2017-11-24 | Discharge: 2017-11-25 | Payer: PRIVATE HEALTH INSURANCE

## 2017-12-08 ENCOUNTER — Encounter: Admit: 2017-12-08 | Discharge: 2017-12-08 | Payer: PRIVATE HEALTH INSURANCE

## 2017-12-08 ENCOUNTER — Ambulatory Visit: Admit: 2017-12-08 | Discharge: 2017-12-08 | Payer: PRIVATE HEALTH INSURANCE

## 2017-12-08 DIAGNOSIS — N63 Unspecified lump in unspecified breast: Secondary | ICD-10-CM

## 2017-12-08 DIAGNOSIS — N6002 Solitary cyst of left breast: Principal | ICD-10-CM

## 2017-12-08 MED ORDER — CLINDAMYCIN HCL 300 MG CAPSULE
ORAL_CAPSULE | Freq: Three times a day (TID) | ORAL | 0 refills | 0 days | Status: CP
Start: 2017-12-08 — End: 2017-12-29

## 2017-12-08 MED ORDER — SULFAMETHOXAZOLE 800 MG-TRIMETHOPRIM 160 MG TABLET
ORAL_TABLET | Freq: Two times a day (BID) | ORAL | 0 refills | 0 days | Status: CP
Start: 2017-12-08 — End: 2017-12-18

## 2017-12-15 ENCOUNTER — Ambulatory Visit
Admit: 2017-12-15 | Discharge: 2017-12-16 | Payer: PRIVATE HEALTH INSURANCE | Attending: Dermatology | Primary: Dermatology

## 2017-12-15 DIAGNOSIS — L732 Hidradenitis suppurativa: Principal | ICD-10-CM

## 2017-12-15 DIAGNOSIS — Z79899 Other long term (current) drug therapy: Secondary | ICD-10-CM

## 2017-12-15 MED ORDER — TRETINOIN 0.025 % TOPICAL CREAM
Freq: Every evening | TOPICAL | 3 refills | 0.00000 days | Status: CP
Start: 2017-12-15 — End: 2018-12-15

## 2018-01-25 ENCOUNTER — Encounter: Admit: 2018-01-25 | Discharge: 2018-01-26 | Payer: PRIVATE HEALTH INSURANCE

## 2018-01-25 DIAGNOSIS — L910 Hypertrophic scar: Secondary | ICD-10-CM

## 2018-01-25 DIAGNOSIS — Z79899 Other long term (current) drug therapy: Secondary | ICD-10-CM

## 2018-01-25 DIAGNOSIS — D649 Anemia, unspecified: Secondary | ICD-10-CM

## 2018-01-25 DIAGNOSIS — L732 Hidradenitis suppurativa: Principal | ICD-10-CM

## 2018-01-25 LAB — CBC W/ AUTO DIFF
BASOPHILS ABSOLUTE COUNT: 0 10*9/L (ref 0.0–0.1)
BASOPHILS RELATIVE PERCENT: 0.6 %
EOSINOPHILS ABSOLUTE COUNT: 0.1 10*9/L (ref 0.0–0.4)
EOSINOPHILS RELATIVE PERCENT: 1.8 %
HEMATOCRIT: 25.1 % — ABNORMAL LOW (ref 36.0–46.0)
HEMOGLOBIN: 6.8 g/dL — ABNORMAL LOW (ref 13.5–16.0)
LARGE UNSTAINED CELLS: 2 % (ref 0–4)
LYMPHOCYTES ABSOLUTE COUNT: 1.3 10*9/L — ABNORMAL LOW (ref 1.5–5.0)
LYMPHOCYTES RELATIVE PERCENT: 19.3 %
MEAN CORPUSCULAR HEMOGLOBIN CONC: 27 g/dL — ABNORMAL LOW (ref 31.0–37.0)
MEAN CORPUSCULAR HEMOGLOBIN: 15.4 pg — ABNORMAL LOW (ref 26.0–34.0)
MEAN CORPUSCULAR VOLUME: 57.1 fL — ABNORMAL LOW (ref 80.0–100.0)
MEAN PLATELET VOLUME: 8.4 fL (ref 7.0–10.0)
MONOCYTES RELATIVE PERCENT: 4.4 %
NEUTROPHILS ABSOLUTE COUNT: 4.8 10*9/L (ref 2.0–7.5)
NEUTROPHILS RELATIVE PERCENT: 72.1 %
PLATELET COUNT: 396 10*9/L (ref 150–440)
RED BLOOD CELL COUNT: 4.39 10*12/L (ref 4.00–5.20)
RED CELL DISTRIBUTION WIDTH: 20.6 % — ABNORMAL HIGH (ref 12.0–15.0)
WBC ADJUSTED: 6.7 10*9/L (ref 4.5–11.0)

## 2018-01-25 LAB — HEMOGLOBIN A1C
ESTIMATED AVERAGE GLUCOSE: 123 mg/dL
HEMOGLOBIN A1C: 5.9 % — ABNORMAL HIGH (ref 4.8–5.6)

## 2018-01-25 LAB — SLIDE REVIEW

## 2018-01-25 LAB — AST (SGOT): Aspartate aminotransferase:CCnc:Pt:Ser/Plas:Qn:: 25

## 2018-01-25 LAB — BLOOD UREA NITROGEN: Urea nitrogen:MCnc:Pt:Ser/Plas:Qn:: 10

## 2018-01-25 LAB — CREATININE
Creatinine:MCnc:Pt:Ser/Plas:Qn:: 0.61
EGFR CKD-EPI AA FEMALE: >90 mL/min/{1.73_m2} (ref >=60)

## 2018-01-25 LAB — ALT (SGPT): Alanine aminotransferase:CCnc:Pt:Ser/Plas:Qn:: 28

## 2018-01-25 LAB — HEPATITIS B SURFACE ANTIBODY
HEPATITIS B SURFACE ANTIBODY: NONREACTIVE
Hepatitis B virus surface Ab:PrThr:Pt:Ser:Ord:: NONREACTIVE

## 2018-01-25 LAB — HEPATITIS B SURFACE ANTIGEN: Hepatitis B virus surface Ag:PrThr:Pt:Ser:Ord:: NONREACTIVE

## 2018-01-25 LAB — HEPATITIS C ANTIBODY: Hepatitis C virus Ab:PrThr:Pt:Ser:Ord:: NONREACTIVE

## 2018-01-25 LAB — AST: AST (SGOT): 25 U/L (ref 14–38)

## 2018-01-25 LAB — HEPATITIS B CORE TOTAL ANTIBODY: Hepatitis B virus core Ab:PrThr:Pt:Ser/Plas:Ord:IA: NONREACTIVE

## 2018-01-25 LAB — ESTIMATED AVERAGE GLUCOSE: Estimated average glucose:MCnc:Pt:Bld:Qn:Estimated from glycated hemoglobin: 123

## 2018-01-25 LAB — BASOPHILIC STIPPLING

## 2018-01-25 LAB — MONOCYTES RELATIVE PERCENT: Lab: 4.4

## 2018-01-25 MED ORDER — ADALIMUMAB PEN CITRATE FREE 40 MG/0.4 ML: each | 11 refills | 0 days | Status: AC

## 2018-01-25 MED ORDER — ADALIMUMAB 80 MG/0.8 ML SUBCUTANEOUS PEN KIT
PACK | SUBCUTANEOUS | 0 refills | 0.00000 days | Status: CP
Start: 2018-01-25 — End: 2018-01-25

## 2018-01-25 MED ORDER — ADALIMUMAB PEN CITRATE FREE 40 MG/0.4 ML
PEN_INJECTOR | SUBCUTANEOUS | 11 refills | 0.00000 days | Status: CP
Start: 2018-01-25 — End: 2018-07-09

## 2018-01-25 MED ORDER — ADALIMUMAB 80 MG/0.8 ML SUBCUTANEOUS PEN KIT: kit | 0 refills | 0 days | Status: AC

## 2018-01-25 MED ORDER — DIAZEPAM 5 MG TABLET
ORAL_TABLET | 0 refills | 0 days | Status: CP
Start: 2018-01-25 — End: 2018-03-30

## 2018-01-25 NOTE — Unmapped (Signed)
Per test claim for Humira at the [facility], patient needs Medication Assistance Program for Prior Authorization.

## 2018-01-25 NOTE — Unmapped (Signed)
ASSESSMENT/PLAN:  Hidradenitis Suppurativa   1. We discussed the typical natural history, pathogenesis, treatment options, and expected course as well as the relapsing and sometimes recalcitrant nature of the disease.    2. Start Humira 160mg  day 1, then 80mg  day 15, then 40mg  [place] qweek starting day 29 for treatment of hidradenitis.  Discussed risks of tuberculosis, other uncommon infections, theoretical risk of malignancies, and other uncommon side effects. Baseline labs including quant gold today.  3.  Baseline labs today.  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  Rec'd OTC iron and f/u with PCP or referral to hematology if she doesn't have one.  4. Will set up for unroofings of axillae, each done at separate times. Valium 10mg  30 minutes prior. Consent signed today.  5. Screening A1c today    RTC: 2-3 months     SUBJECTIVE:    CC: Hidradenitis Suppurativa    Kelly Norton is a 42 y.o. female  who is seen for evaluation of hidradenitis suppurativa.  Saw Dr. Carles Collet 11/2017 and restarted Humira after 1 year lapse.  Self-reported severity (0-5) not reported  VAS pain today: 9  VAS average pain for the last month: 9  Requiring pain medication? Yes.  If so, what type/frequency? ibuprofen  How often in pain?  few times a day  Level of odor (0-5): 3  Level of itching (0-5): 4  Dressing changes needed for drainage:Twice a day  How much drainage: some drainage  Flare in the last month (Y/N)? Yes.  How long ago was the last flare? in last 6 months  Developing new lesions? once a month  Number of inflammatory lesions montly: 3-5  DLQI: 15  Current treatment: Humira     How helpful is the current treatment in managing the following aspects of your disease?  Not at all helpful Somewhat helpful Very helpful   Pain      Decreasing length of flares      Decreasing new lesions      Drainage      Decreasing frequency of flares Decreasing severity of flares      Odor        Disease course:  Year when symptoms first noticed: 2012  Year of diagnosis: 2012  Who diagnosed you? Dermatologist  Location of first symptoms: axillae and inner thighs  Typical involved areas include: groin and axillae  Typical number of inflammatory lesions each month at baseline (from first visit): 3-5  Disease triggers: stress    Are menstrual cycles irregular when not on birth control? No.  Current form of contraception: none  Effect of hormonal contraception on disease: no change  Flaring with menstrual cycle (before, during, or after?): before  Difficulty becoming pregnancy? No.  Pregnancy complications? c-section  How many children? 2  Better or worse with pregnancy?  no change.  If so, worse during a specific trimester? No change    FH:      Patient Mother Father Son Daughter Brother Sister Maternal Grandmother Maternal Grandfather Paternal Grandmother Paternal Grandfather Maternal Aunt Maternal Uncle Paternal Aunt Paternal Uncle Other:   Derm Hidradenitis Suppurativa  *                                   Pilonidal sinus  Acne                                      Dissecting cellulitis(Scalp)                                     Eczema                                     Allergies                    Rheum Joint pains                                      SAPHO                                     Pyoderma gangrenosum                                     Back pain                                    Auto-immune disease                                     Asthma                    Endo Polycystic ovarian syndrome                                     Thyroid disease                    Vitamin D deficiency                   Psych Anxiety                                     Depression                                     Dementia                                     Suicidal thoughts*                                   Cardio Hypertension High cholesterol  Heart attack                                    Stroke                                     Hem-onc Cancer  ______________                                    Anemia                    GI IBD (UC/Crohn's)                                   ID HIV                     Syphilis                     Other                         Social History:  Current or former smoker? never  Amount smoking: n/a  How many years: n/a  ED visits in the last 5 years? 1-4  Difficulty affording medications? always  Marital Status: not answered  Living with some one? No.    Prior treatments:  Topical: clinda  Systemic: Doxy, Humira, Clindamycin, Bactrim  Past surgical procedures: none  Past laser procedures: none          ROS: the balance of 10 systems is negative unless otherwise documented      OBJECTIVE:   Gen: Well-appearing patient, appropriate, interactive, in no acute distress  Skin: Examination of the scalp, face, neck, chest, back, abdomen, bilateral upper and lower extremities, hands, palms, soles, nails, buttocks, and external genitalia performed today and pertinent for:     location Abscess Inflamed nodule Non-inflamed nodule Draining sinus Non-draining Sinus Hurley % scar   R axilla  2  3  3     L axilla  2  3  3     R inframammary          L inframammary          Intermammary          Pubic          R inguinal          R thigh          L inguinal          L thigh          Scrotum/Vulva          Perianal          R buttock          L buttock          Other (list)                      AN count (total sum of abscess and inflammatory nodule): 4  Pilonidal sinus (Y/N, or previously treated)? No.  Approximate BSA involved by inflammatory lesions: 4  Intertriginous comedones: few  Diffuse comedones (trunk, face, etc): few  Acne scars: facial  Cribriform scarring: No.  Intertrigionus epidermal inclusion cysts: 1-3  Diffuse (trunk, feace, extremities) epidermal inclusion cysts: 3-10  Regular phenotype

## 2018-01-25 NOTE — Unmapped (Signed)

## 2018-01-26 LAB — QUANTIFERON TB GOLD PLUS
QUANTIFERON ANTIGEN 1 MINUS NIL: 0 [IU]/mL
QUANTIFERON ANTIGEN 2 MINUS NIL: -0.01 [IU]/mL
QUANTIFERON TB NIL VALUE: 0.03 [IU]/mL

## 2018-01-26 LAB — QUANTIFERON TB NIL VALUE: Lab: 0.03

## 2018-01-27 MED ORDER — EMPTY CONTAINER
2 refills | 0 days
Start: 2018-01-27 — End: 2019-01-27

## 2018-01-27 MED FILL — HUMIRA PEN-CD/UC/HS STARTER/*CITRATE FREE/PNKT: HUMIRA PEN-CD/UC/HS STARTER/*CITRATE FREE/PNKT | 28 days supply | Qty: 3 | Fill #0

## 2018-01-27 NOTE — Unmapped (Signed)
[facility] Special[facility] Medication Referral: PA APPROVED    Medication (Brand/Generic): HUMIRA 40MG /0.4ML    Initial FSI Test Claim completed with resulted information below:  No PA required  Patient ABLE to fill at [facility]  Insurance Company:  [employer] [place]  Anticipated Copay: $5.00  Is anticipated copay with a copay card or grant? YES    As Co-pay is under $100 defined limit, per policy there will be no further investigation of need for financial assistance at this time unless patient requests. This referral has been communicated to the provider and handed off to the [facility] team for further processing and filling of prescribed medication.   ______________________________________________________________________  Please utilize this referral for viewing purposes as it will serve as the central location for all relevant documentation and updates.

## 2018-01-27 NOTE — Unmapped (Signed)
[facility]   Patient Onboarding/Medication Counseling    Kelly Norton is a 42 y.o. female with hidradenitis who I am counseling today on initiation of therapy.    Medication: Humira, sharps container    Verified patient's date of birth / HIPAA.      Education Provided: ??    Dose/Administration discussed: 2 pens (160 mg) under the skin on day 1, 1 pen (80 mg) on day 15, then 1 pen (40 mg) every week beginning day 29. This medication should be taken  without regard to food.  Stressed the importance of taking medication as prescribed and to contact provider if that changes at any time.  Discussed missed dose instructions.    Storage requirements: this medicine should be stored in the refrigerator.     Side effects / precautions discussed: Discussed common side effects, including injection site reaction, risk of infection. If patient experiences fever/chills or severe injection reaction, they need to call the doctor.  Patient will receive a drug information handout with shipment.    Handling precautions / disposal reviewed:  Patient will dispose of needles in a sharps container or empty laundry detergent bottle.    Drug Interactions: other medications reviewed and up to date in Epic.  No drug interactions identified.    Comorbidities/Allergies: reviewed and up to date in Epic.    Verified therapy is appropriate and should continue      Delivery Information    Medication Assistance provided: PA & copay assist    Anticipated copay of $0 reviewed with patient. Verified delivery address in FSI and reviewed medication storage requirement.    Scheduled delivery date:     Explained that we ship using UPS or courier and this shipment will not require a signature.      Scheduled delivery: Thurs, Aug 8    Explained the services we provide at [facility] and that each month we would call to set up refills.  Stressed importance of returning phone calls so that we could ensure they receive their medications in time each month.  Informed patient that we should be setting up refills 7-10 days prior to when they will run out of medication.  Informed patient that welcome packet will be sent.      Patient verbalized understanding of the above information as well as how to contact the pharmacy at [phone] option 4 with any questions/concerns.  The pharmacy is open Monday through Friday 8:30am-4:30pm.  A pharmacist is available 24/7 via pager to answer any clinical questions they may have.        Patient Specific Needs      ? Patient has no physical, cognitive, or cultural barriers.    ? Patient prefers to have medications discussed with  Patient     ? Patient is able to read and understand education materials at a high school level or above.    ? Patient's primary language is  English           Presenter, broadcasting  [facility] Specialty Pharmacist

## 2018-01-28 MED FILL — SHARPS KIT/NA/MISC: SHARPS KIT/NA/MISC | 120 days supply | Qty: 1 | Fill #0

## 2018-02-09 MED ORDER — ADALIMUMAB PEN CITRATE FREE 40 MG/0.4 ML
11 refills | 0 days | Status: CP
Start: 2018-02-09 — End: 2018-07-09
  Filled 2018-02-19: qty 4, 28d supply, fill #0

## 2018-02-16 NOTE — Unmapped (Signed)
[facility] Pharmacy Refill and Clinical Coordination Note  Medication(s): Kelly Norton, DOB: Apr 14, 1976  Phone: [phone] (home) , Alternate phone contact: N/A  Shipping address: [address]  [place] [address]  Phone or address changes today?: No  All above HIPAA information verified.  Insurance changes? No    Completed refill and clinical call assessment today to schedule patient's medication shipment from the [facility] [phone]).      MEDICATION RECONCILIATION    Confirmed the medication and dosage are correct and have not changed: Yes, regimen is correct and unchanged.    Were there any changes to your medication(s) in the past month:  No, there are no changes reported at this time.    ADHERENCE    Is this medicine transplant or covered by Medicare Part B? No.    Did you miss any doses in the past 4 weeks? No missed doses reported.  Adherence counseling provided? Not needed     SIDE EFFECT MANAGEMENT    Are you tolerating your medication?:  Kelly Norton reports tolerating the medication.  Side effect management discussed: None      Therapy is appropriate and should be continued.    Evidence of clinical benefit: See Epic note from na/ not seen since re-starting      FINANCIAL/SHIPPING    Delivery Scheduled: Yes, Expected medication delivery date: Friday, Aug 30 with courier     Additional medications refilled: No additional medications/refills needed at this time.    The patient will receive a drug information handout for each medication shipped and additional FDA Medication Guides as required.      Kelly Norton did not have any additional questions at this time.    Delivery address confirmed in Epic.     We will follow up with patient monthly for standard refill processing and delivery.      Thank you,  Tawanna Solo [facility] Specialty Pharmacist

## 2018-02-19 MED FILL — HUMIRA PEN CITRATE FREE 40 MG/0.4 ML: 28 days supply | Qty: 4 | Fill #0 | Status: AC

## 2018-03-10 NOTE — Unmapped (Signed)
Patient is doing well at this time on the humira  She is seeing a little bit of benefit but knows it can take awhile to see more of a change - she's hoping to see more change soon    [facility] Refill Coordination Note    Specialty Medication(s) to be Shipped:   Inflammatory Disorders: Humira    Other medication(s) to be shipped: n/a     Kelly Norton, DOB: 01/11/1976  Phone: [phone] (home)   Shipping Address: [address]  [place] [address]    All above HIPAA information was verified with patient.     Completed refill call assessment today to schedule patient's medication shipment from the [facility] [phone]).       Specialty medication(s) and dose(s) confirmed: Regimen is correct and unchanged.   Changes to medications: Kelly Norton reports no changes reported at this time.  Changes to insurance: No  Questions for the pharmacist: No    The patient will receive a drug information handout for each medication shipped and additional FDA Medication Guides as required.      DISEASE/MEDICATION-SPECIFIC INFORMATION        For Inflammatory disorders patients on injectable medications: Patient currently has 2 doses left.  Next injection is scheduled for thursday (new box to be opened on 10/3).    ADHERENCE     Medication Adherence    Patient reported X missed doses in the last month:  0  Specialty Medication:  humira  Patient is on additional specialty medications:  No  Patient is on more than two specialty medications:  No  Any gaps in refill history greater than 2 weeks in the last 3 months:  no  Demonstrates understanding of importance of adherence:  yes  Informant:  patient  Reliability of informant:  reliable  Confirmed plan for next specialty medication refill:  delivery by pharmacy  Refills needed for supportive medications:  not needed          Refill Coordination    Has the Patients' Contact Information Changed:  No  Is the Shipping Address Different:  No SHIPPING     Shipping address confirmed in Epic.     Delivery Scheduled: Yes, Expected medication delivery date: 9/26 via UPS or courier. (same day courier)    Kelly Norton   [facility] Specialty Technician

## 2018-03-15 NOTE — Unmapped (Deleted)
ASSESSMENT and PLAN:    CPE-exam done, counselled on healthy diet and exercise.    Pap {Blank single:19197::sent with reflex HPV testing,sent with hrHPV testing,not due, next due:}  CBE done after discussion risks/benefits with pt. Discussed risks/benefits of SBE.  Mammogram: after discussion of risks/benefits with pt--current recs to start at age 42 in normal risk women, with frequency of every 2 years--patient elected to: {Blank single:19197::get mammogram, ordered,defer mammogram, not ordered}  Tdap- up to date  Pneumovax- {Blank single:19197::up to date,not indicated,given today}  Flu- {Blank single:19197::up to date this season,out of season,patient declined,given today}  Cholesterol and diabetic screening {Blank single:19197::up to date,performed today}    {Assess/Plan SmartLinks:21044}    New and existing medications discussed, including benefits, common side effects, and serious adverse effects.  Pt verbalized appropriate understanding of medications and f/u questions were answered.  Adherence and barrier issues addressed on an ongoing basis.      I have addressed current diagnosis and current treatment plans. Patient voices understanding.  ______________________________________________________________________    Reason For Visit:  Physical Exam    HPI:  42 y.o. female here for Physical Exam  Vaginal concerns: {YES / ZO:10960}  Sexually active: {YES / NO:22418} with {Blank multiple:19196::men,women}  Sexual concerns: ***  Lifetime sexual partners: ***  Sexual partners in the last year: ***  H/o STDs: ***  Desire for STD testing today: {YES / NO:22418}  Menarche age: ***  LMP: ***  Menstrual concerns: ***  Contraception:    Caffeine: {AJM caffeine servings:29963}--counseled  Exercise: {AJM healthy habit status:26548}--counseled  Seatbelts: {AJM healthy habit status:26548}--counseled  Counseled against cell phone use and texting while driving.  Sunscreen: {AJM healthy habit status:26548}--counseled  Dental care: {AJM healthy habit status:26548}--counseled  Eye care: {AJM healthy habit status:26548}--counseled    See ROS for concerns.    ROS:  A complete review of systems otherwise negative.    ALLERGIES:  Penicillins    MEDICATIONS:  Patient's Medications   New Prescriptions    No medications on file   Previous Medications    ADALIMUMAB 80 MG/0.8 ML PNKT    INJECT THE CONTENTS OF 2 PENS (160MG ) UNDER THE SKIN ON DAY 1 THEN INJECT THE CONTENTS OF ONE PEN (80MG ) ON DAY 15    ADALIMUMAB PEN CITRATE FREE 40 MG/0.4 ML    INJECT THE CONTENTS OF 1 PEN (40 MG) UNDER THE SKIN EVERY 7 DAYS (Start on day 29 of treatment)    ADALIMUMAB PEN CITRATE FREE 40 MG/0.4 ML    INJECT THE CONTENTS OF ONE PEN (40MG ) UNDER THE SKIN ONCE EACH WEEK    CHLORHEXIDINE (HIBICLENS) 4 % EXTERNAL LIQUID    Frequency:PHARMDIR   Dosage:0.0     Instructions:  Note:daily, used from neck down Dose: 4 %    DIAZEPAM (VALIUM) 5 MG TABLET    Take 2 tablets (10mg ) one hour prior to dermatology procedure    DOXYCYCLINE (VIBRA-TABS) 100 MG TABLET    Take 100 mg by mouth.    EMPTY CONTAINER MISC    USE AS DIRECTED    IBUPROFEN (ADVIL,MOTRIN) 200 MG TABLET    Take 600 mg by mouth 4 (four) times a day as needed for pain.    TRAMADOL (ULTRAM) 50 MG TABLET    Take 50 mg by mouth.    TRETINOIN (RETIN-A) 0.025 % CREAM    Apply 1 application topically nightly. To face    TRIAMCINOLONE (KENALOG) 0.1 % CREAM    Frequency:BID   Dosage:0.0     Instructions:  Note:Dose: 0.1 %   Modified Medications    No medications on file   Discontinued Medications    No medications on file     PAST MEDICAL HISTORY:  No past medical history on file.    OB History     Gravida   2    Para   2    Term   2    Preterm        AB        Living           SAB        TAB        Ectopic        Molar        Multiple        Live Births                  PAST SURGICAL HISTORY:  Past Surgical History:   Procedure Laterality Date   ??? BREAST BIOPSY Left 04/2017    benign   ??? BREAST BIOPSY Left 05/2017    pt told it was benign and fluid was removed  abcessed   ??? BREAST CYST ASPIRATION Left 05/2017    abcessed area fluid was removed   ??? CESAREAN SECTION     ??? SKIN BIOPSY       SOCIAL HISTORY:  Social History     Socioeconomic History   ??? Marital status: Single     Spouse name: Not on file   ??? Number of children: Not on file   ??? Years of education: Not on file   ??? Highest education level: Not on file   Occupational History   ??? Not on file   Social Needs   ??? Financial resource strain: Not on file   ??? Food insecurity:     Worry: Not on file     Inability: Not on file   ??? Transportation needs:     Medical: Not on file     Non-medical: Not on file   Tobacco Use   ??? Smoking status: Never Smoker   ??? Smokeless tobacco: Never Used   Substance and Sexual Activity   ??? Alcohol use: Yes     Alcohol/week: 0.0 standard drinks     Comment: occasionally   ??? Drug use: Never   ??? Sexual activity: Not on file   Lifestyle   ??? Physical activity:     Days per week: Not on file     Minutes per session: Not on file   ??? Stress: Not on file   Relationships   ??? Social connections:     Talks on phone: Not on file     Gets together: Not on file     Attends religious service: Not on file     Active member of club or organization: Not on file     Attends meetings of clubs or organizations: Not on file     Relationship status: Not on file   Other Topics Concern   ??? Do you use sunscreen? No   ??? Tanning bed use? No   ??? Are you easily burned? No   ??? Excessive sun exposure? No   ??? Blistering sunburns? No   Social History Narrative   ??? Not on file     FAMILY HISTORY:  Family History   Problem Relation Age of Onset   ??? Diabetes Mother    ??? Colon cancer Father    ???  Melanoma Neg Hx    ??? Basal cell carcinoma Neg Hx    ??? Squamous cell carcinoma Neg Hx      PHYSICAL EXAM:  VITAL SIGNS:  There were no vitals taken for this visit.  Wt Readings from Last 2 Encounters:   01/25/18 (!) 107 kg (236 lb)   12/08/17 (!) 106.7 kg (235 lb 4.8 oz)     General: NAD, Alert, oriented x 3  EYES:  Sclera and conjunctiva clear, no drainage, PERRLA, EOMI  HEENT:  TMs-clear, nares w/o discharge, mucus membranes moist, oropharynx without erythema or exudates  LYMPH NODES: No cervical, supraclavicular, axillary lymphadenopathy  SKIN: no rashes  BREASTS: no skin changes, axillary LAD, nipple discharge or mass bilaterally  CVS: Regular rhythm, normal rate, normal S1/S2, no murmurs, rubs or gallops  LUNGS: CTAB, w/o crackles/wheezes/rhonchi, good air movement, no increased WOB, no retractions  ABDOMEN: NABS, soft, non-distended, non-tender, no HSM or mass  GU:  Normal external GU, normal appearing cervix, no vaginal discharge; bimanual exam with normal sized uterus, no masses or tenderness; no CMT; no adnexal masses or tenderness.  EXTREMITIES: no LE edema  MUSCULOSKELETAL: normal tone UEs/LEs  NEURO: CN 2-12 grossly intact, patellar reflexes 2+ bilaterally  PSYCH: pleasant affect, well kept, normal mood and affect

## 2018-03-18 MED FILL — HUMIRA PEN CITRATE FREE 40 MG/0.4 ML: 28 days supply | Qty: 4 | Fill #1

## 2018-03-18 MED FILL — HUMIRA PEN CITRATE FREE 40 MG/0.4 ML: 28 days supply | Qty: 4 | Fill #1 | Status: AC

## 2018-03-30 MED ORDER — DIAZEPAM 5 MG TABLET
ORAL_TABLET | ORAL | 0 refills | 0.00000 days | Status: CP
Start: 2018-03-30 — End: 2018-04-27

## 2018-04-05 ENCOUNTER — Ambulatory Visit: Admit: 2018-04-05 | Discharge: 2018-04-06 | Payer: PRIVATE HEALTH INSURANCE

## 2018-04-05 DIAGNOSIS — L910 Hypertrophic scar: Secondary | ICD-10-CM

## 2018-04-05 DIAGNOSIS — L732 Hidradenitis suppurativa: Secondary | ICD-10-CM

## 2018-04-05 DIAGNOSIS — Z79899 Other long term (current) drug therapy: Secondary | ICD-10-CM

## 2018-04-05 MED ORDER — HYDROCODONE 5 MG-ACETAMINOPHEN 325 MG TABLET
ORAL_TABLET | 0 refills | 0 days | Status: CP
Start: 2018-04-05 — End: 2018-05-03

## 2018-04-05 MED ORDER — IBUPROFEN 800 MG TABLET
ORAL_TABLET | 2 refills | 0 days | Status: CP
Start: 2018-04-05 — End: ?

## 2018-04-05 NOTE — Unmapped (Signed)
ASSESSMENT/PLAN:  Hidradenitis Suppurativa   1. We discussed the typical natural history, pathogenesis, treatment options, and expected course as well as the relapsing and sometimes recalcitrant nature of the disease.    2. Continue humira 40mg  Holt qweek for treatment of hidradenitis (start 01/2018).  Discussed risks of tuberculosis, other uncommon infections, theoretical risk of malignancies, and other uncommon side effects. Baseline labs including quant gold today.  3.  Baseline labs today.  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  Rec'd OTC iron previously and f/u with PCP or referral to hematology if she doesn't have one.  4. Discussed unroofings - proceed with R today, plan on L in the future    Unroofing procedure with excision for hidradedinitis in location R upper arm, R axilla:  Risk, benefits, and alternatives were discussed.  Following alcohol prep, anesthesia with 0.5% lidocaine with epinephrine was first performed with a total of 80ml, and then fistula probe and/or forceps was used to delineate the involved area.  Iris scissor was used to open all detectable sinuses. Complex sequential unroofing and wound contouring with beveling was performed with scissor or blade as appropriate to assure second intention healing.  Hemostasis was obtained in the usual fashion with pressure, AlCl solution, and/or cautery.  Area was dressed with petrolatum and bandage.  Wound care instructions given. We will contact the patient with results when available.  Size of wound 90 x 40 and 10 x 50 mm in R axilla, 40 x 40 mm on R upper arm.        RTC: 2-3 months     SUBJECTIVE:    CC: Hidradenitis Suppurativa    Kelly Norton is a 42 y.o. female  who is seen for evaluation of hidradenitis suppurativa.  Restarted Humira 01/2018 and has noticed only slight improvement so far. Continues to have drainage, but reduced pain in bilateral axillae. Groin not affected.  Considering surgery for today for the axillae.  Disease course:  Year when symptoms first noticed: 2012  Year of diagnosis: 2012  Who diagnosed you? Dermatologist  Location of first symptoms: axillae and inner thighs  Typical involved areas include: groin and axillae  Typical number of inflammatory lesions each month at baseline (from first visit): 3-5  Disease triggers: stress    Are menstrual cycles irregular when not on birth control? No.  Current form of contraception: none  Effect of hormonal contraception on disease: no change  Flaring with menstrual cycle (before, during, or after?): before  Difficulty becoming pregnancy? No.  Pregnancy complications? c-section  How many children? 2  Better or worse with pregnancy?  no change.  If so, worse during a specific trimester? No change    FH:      Patient Mother Father Son Daughter Brother Sister Maternal Grandmother Maternal Grandfather Paternal Grandmother Paternal Grandfather Maternal Aunt Maternal Uncle Paternal Aunt Paternal Uncle Other:   Derm Hidradenitis Suppurativa  *                                   Pilonidal sinus                                     Acne  Dissecting cellulitis(Scalp)                                     Eczema                                     Allergies                    Rheum Joint pains                                      SAPHO                                     Pyoderma gangrenosum                                     Back pain                                    Auto-immune disease                                     Asthma                    Endo Polycystic ovarian syndrome                                     Thyroid disease                    Vitamin D deficiency                   Psych Anxiety                                     Depression                                     Dementia                                     Suicidal thoughts* Cardio Hypertension                                     High cholesterol                                     Heart attack  Stroke                                     Hem-onc Cancer  ______________                                    Anemia                    GI IBD (UC/Crohn's)                                   ID HIV                     Syphilis                     Other                         Social History:  Current or former smoker? never  Amount smoking: n/a  How many years: n/a  ED visits in the last 5 years? 1-4  Difficulty affording medications? always  Marital Status: not answered  Living with some one? No.    Prior treatments:  Topical: clinda  Systemic: Doxy, Humira, Clindamycin, Bactrim  Past surgical procedures: none  Past laser procedures: none          ROS: the balance of 10 systems is negative unless otherwise documented      OBJECTIVE:   Gen: Well-appearing patient, appropriate, interactive, in no acute distress  Skin: Examination of the scalp, face, neck, chest, back, abdomen, bilateral upper and lower extremities, hands, palms, soles, nails, buttocks, and external genitalia performed today and pertinent for:     location Abscess Inflamed nodule Non-inflamed nodule Draining sinus Non-draining Sinus Hurley % scar   R axilla  2  3  3     L axilla  2  3  3     R inframammary          L inframammary          Intermammary          Pubic          R inguinal          R thigh          L inguinal          L thigh          Scrotum/Vulva          Perianal          R buttock          L buttock          Other (list)                      AN count (total sum of abscess and inflammatory nodule): 4  Pilonidal sinus (Y/N, or previously treated)? No.  Approximate BSA involved by inflammatory lesions: 4  Intertriginous comedones: few  Diffuse comedones (trunk, face, etc): few  Acne scars: facial  Cribriform scarring: No.  Intertrigionus epidermal inclusion cysts: 1-3  Diffuse (trunk, feace, extremities) epidermal inclusion cysts: 3-10  Regular phenotype

## 2018-04-05 NOTE — Unmapped (Signed)
Managing Your Wound After Skin Surgery  ? Please avoid strenuous activity for 48 hours and all heavy exercise for one week.  o If your wound is on your arm, leg, or shoulder, then avoid heavy lifting, straining, or exercise with that limb for at least one week.  o If your wound is on the head or neck, avoid bending over and sleep with your head elevated for 48 hours to reduce the risk of bleeding.  o Please do not smoke for 3 weeks; smoking prevents healthy wound healing.  ? Pain Management: Use 650mg  of acetaminophen (Tylenol) every 4 hours, or medication that we prescribe as needed.  Pain should decrease steadily for the first few days after your surgery.  o Avoid ibuprofen (Motrin), naproxen (Aleve), aspirin, or alcohol for 48 hours because they can increase the risk of bleeding.  You should continue aspirin if you already take it regularly.  ? Possible complications:  o Bleeding: A small amount of blood on the bandage is normal.  For significant bleeding into the bandage or beneath the stitches (this will look like swelling and purple discoloration), apply firm pressure with a cloth or bandage for 20 minutes without interruption.  Repeat if bleeding continues.  If it persists, please call [phone].  During non-office hours use the message at this number to contact the on-call dermatologist or go to the emergency room.  o Infection:  Usually indicated by pain that increases 4-6 days after surgery. Mild redness, swelling, and soreness are normal, but with increasing redness, pain, drainage, and swelling you should contact our office.  o Decreased sensation, increased sensitivity, and itching may last for 18 months.  o Scarring: Scars mature for one year after surgery.  Reducing motion and tension over the wound for the first month, as well as sun-protection, reduces scarring.    o Bruising is normal around the wound and resolves over 2-3 weeks.  For Wounds with Dissolving Stitches:  ? Please keep the original bandage in place for at least 24 hours.  You can remove the large outer layer at that time and keep the smaller bandage clean and dry for one week or until you return.    ? If the bandage becomes soiled, wet, or detached you can reinforce it with tape or add petrolatum (Vaseline) to the stitches, then place a fresh, clean, non-stick bandage (ie Telfa) with tape or Band-aid.  For Wounds with Stitches that Need to be Removed:  ? Please keep the original bandage in place for at least 24 hours.  You can remove the large outer layer at that time.  You may leave the smaller bandage in place until it falls off by itself, or change it after it gets wet with bathing.  To change, apply a thick layer of petrolatum (Vaseline) over top of the Steri-strips (thin white strips) and cover with a non-stick bandage (ie Telfa) with tape or a  Band-aid.  If you are changing the bandage you can gently remove any crust with warm water and mild soap using a soft cloth or Q-tip.  Do not use triple-antibiotic ointment, Neosporin, or hydrogen peroxide.    For open wounds:  Each day or each time the dressing is soiled or disrupted, gently remove any excess crusting or oozing with a warm wet wash cloth or gently allow water to run over the area. Apply a thick layer of Vaseline, and then re-cover the area with a non-stick bandage and tape or another bandage of your  choosing. The thick layer of Vaseline will help soothe the pain of the wound and allow it to heal faster.    If crusting builds up a warm washcloth with mild soap or a mixture of 1 tablespoon of white vinegar in a cup of warm can be applied to the area for 5-10 minutes and then used to gently wipe away any excess.    Healing is best when the wound fills in from the bottom and sides. If the edges of the wound are close together it is important to keep the Vaseline in the wound and use the non-stick dressing pressed gently into the wound to keep the edges from sealing together first.    After the Stitches and Bandages Have Been Removed:  Clean daily with warm water and gentle soap using a soft cloth.  You can continue to use a small bandage and sunscreen for 1-2 months or until fully healed.

## 2018-04-08 NOTE — Unmapped (Signed)
Patient is doing ok at this time  She had a surgery Monday for HS under her armpit. [place] Spaniel it went well. Probably hurts more now than then but it should heal up soon.   The humira has helped her a little bit but she hasn't seen that much improvement overall  She's hoping the longer she's on it the more improvement she will see    [facility] Refill Coordination Note    Specialty Medication(s) to be Shipped:   Inflammatory Disorders: Humira    Other medication(s) to be shipped: n/a     Kelly Norton, DOB: 29-Oct-1975  Phone: [phone] (home)       All above HIPAA information was verified with patient.     Completed refill call assessment today to schedule patient's medication shipment from the [facility] [phone]).       Specialty medication(s) and dose(s) confirmed: Regimen is correct and unchanged.   Changes to medications: Kelly Norton reports no changes reported at this time.  Changes to insurance: No  Questions for the pharmacist: No    The patient will receive a drug information handout for each medication shipped and additional FDA Medication Guides as required.      DISEASE/MEDICATION-SPECIFIC INFORMATION        For Inflammatory disorders patients on injectable medications: Patient currently has 1 doses left.  Next injection is scheduled for today.    ADHERENCE     No missed doses reported at this time- no meds held for her surgery monday    SHIPPING     Shipping address confirmed in Epic.     Delivery Scheduled: Yes, Expected medication delivery date: 10/23 via UPS or courier.     Medication will be delivered via Same Day Courier to the home address in Epic WAM.    Kelly Norton   [facility] Specialty Technician

## 2018-04-08 NOTE — Unmapped (Signed)
Contacted patient with result via MyChart on 04/07/18  No tracking needed

## 2018-04-12 MED ORDER — LIDOCAINE 5 % TOPICAL OINTMENT
2 refills | 0 days | Status: CP
Start: 2018-04-12 — End: 2018-05-03

## 2018-04-12 MED ORDER — TRAMADOL 50 MG TABLET
ORAL_TABLET | 0 refills | 0 days | Status: CP
Start: 2018-04-12 — End: ?

## 2018-04-14 MED FILL — HUMIRA PEN CITRATE FREE 40 MG/0.4 ML: 28 days supply | Qty: 4 | Fill #2

## 2018-04-14 MED FILL — HUMIRA PEN CITRATE FREE 40 MG/0.4 ML: 28 days supply | Qty: 4 | Fill #2 | Status: AC

## 2018-04-27 MED ORDER — DIAZEPAM 5 MG TABLET
ORAL_TABLET | ORAL | 0 refills | 0.00000 days | Status: CP
Start: 2018-04-27 — End: ?

## 2018-05-03 ENCOUNTER — Encounter: Admit: 2018-05-03 | Discharge: 2018-05-04 | Payer: PRIVATE HEALTH INSURANCE

## 2018-05-03 DIAGNOSIS — Z79899 Other long term (current) drug therapy: Secondary | ICD-10-CM

## 2018-05-03 DIAGNOSIS — D649 Anemia, unspecified: Secondary | ICD-10-CM

## 2018-05-03 DIAGNOSIS — L732 Hidradenitis suppurativa: Principal | ICD-10-CM

## 2018-05-03 DIAGNOSIS — L910 Hypertrophic scar: Secondary | ICD-10-CM

## 2018-05-03 MED ORDER — LIDOCAINE 5 % TOPICAL OINTMENT
2 refills | 0 days | Status: CP
Start: 2018-05-03 — End: ?

## 2018-05-03 MED ORDER — HYDROCODONE 5 MG-ACETAMINOPHEN 325 MG TABLET
ORAL_TABLET | 0 refills | 0 days | Status: CP
Start: 2018-05-03 — End: ?

## 2018-05-03 NOTE — Unmapped (Signed)
ASSESSMENT/PLAN:  Hidradenitis Suppurativa   1. We discussed the typical natural history, pathogenesis, treatment options, and expected course as well as the relapsing and sometimes recalcitrant nature of the disease.    2. Continue humira 40mg  Greenlee qweek for treatment of hidradenitis (start 01/2018).  Discussed risks of tuberculosis, other uncommon infections, theoretical risk of malignancies, and other uncommon side effects. Baseline labs including quant gold today.  3.  Baseline labs today.  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 Referring to hematology to consider iron infusion and further workup. Ferritin, iron panel, CBC w diff today.  4. Discussed unroofings - proceed with it on L today    Unroofing procedure with excision for hidradedinitis in location L upper arm, L axilla:  Risk, benefits, and alternatives were discussed.  Following alcohol prep, anesthesia with 0.5% lidocaine with epinephrine was first performed with a total of 80ml, and then fistula probe and/or forceps was used to delineate the involved area.  Iris scissor was used to open all detectable sinuses. Complex sequential unroofing and wound contouring with beveling was performed with scissor or blade as appropriate to assure second intention healing.  Hemostasis was obtained in the usual fashion with pressure, AlCl solution, and/or cautery.  Area was dressed with petrolatum and bandage.  Wound care instructions given. We will contact the patient with results when available.  Size of wound 90 x 40 and 10 x 50 mm in R axilla, 40 x 40 mm on R upper arm.    RTC: 2-3 months     SUBJECTIVE:    CC: Hidradenitis Suppurativa    Kelly Norton is a 42 y.o. female  who is seen for evaluation of hidradenitis suppurativa.  Restarted Humira 01/2018 and has noticed partial improvement so far.  Right axillary deroofing procedure is healing well so far.  She does feel some restriction of range of motion related to the scar.   groin not affected.  Considering surgery for today for the axillae.  Disease course:  Year when symptoms first noticed: 2012  Year of diagnosis: 2012  Who diagnosed you? Dermatologist  Location of first symptoms: axillae and inner thighs  Typical involved areas include: groin and axillae  Typical number of inflammatory lesions each month at baseline (from first visit): 3-5  Disease triggers: stress    Are menstrual cycles irregular when not on birth control? No.  Current form of contraception: none  Effect of hormonal contraception on disease: no change  Flaring with menstrual cycle (before, during, or after?): before  Difficulty becoming pregnancy? No.  Pregnancy complications? c-section  How many children? 2  Better or worse with pregnancy?  no change.  If so, worse during a specific trimester? No change    FH:      Patient Mother Father Son Daughter Brother Sister Maternal Grandmother Maternal Grandfather Paternal Grandmother Paternal Grandfather Maternal Aunt Maternal Uncle Paternal Aunt Paternal Uncle Other:   Derm Hidradenitis Suppurativa  *                                   Pilonidal sinus                                     Acne  Dissecting cellulitis(Scalp)                                     Eczema                                     Allergies                    Rheum Joint pains                                      SAPHO                                     Pyoderma gangrenosum                                     Back pain                                    Auto-immune disease                                     Asthma                    Endo Polycystic ovarian syndrome                                     Thyroid disease                    Vitamin D deficiency                   Psych Anxiety                                     Depression                                     Dementia Suicidal thoughts*                                   Cardio Hypertension                                     High cholesterol                                     Heart attack  Stroke                                     Hem-onc Cancer  ______________                                    Anemia                    GI IBD (UC/Crohn's)                                   ID HIV                     Syphilis                     Other                         Social History:  Current or former smoker? never  Amount smoking: n/a  How many years: n/a  ED visits in the last 5 years? 1-4  Difficulty affording medications? always  Marital Status: not answered  Living with some one? No.    Prior treatments:  Topical: clinda  Systemic: Doxy, Humira, Clindamycin, Bactrim  Past surgical procedures: none  Past laser procedures: none          ROS: the balance of 10 systems is negative unless otherwise documented      OBJECTIVE:   Gen: Well-appearing patient, appropriate, interactive, in no acute distress  Skin: Examination of the scalp, face, neck, chest, back, abdomen, bilateral upper and lower extremities, hands, palms, soles, nails, buttocks, and external genitalia performed today and pertinent for:     location Abscess Inflamed nodule Non-inflamed nodule Draining sinus Non-draining Sinus Hurley % scar   R axilla      3    L axilla  1  1 2 3     R inframammary          L inframammary          Intermammary          Pubic          R inguinal          R thigh          L inguinal          L thigh          Scrotum/Vulva          Perianal          R buttock          L buttock          Other (list)                    Ulcer with healthy granulation tissue in the right axilla.  This is less than 50% of the size of the original wound.  AN count (total sum of abscess and inflammatory nodule): 1  Pilonidal sinus (Y/N, or previously treated)? No.  Approximate BSA involved by inflammatory lesions: 4 Intertriginous comedones: few  Diffuse comedones (trunk, face, etc): few  Acne scars: facial  Cribriform scarring: No.  Intertrigionus epidermal inclusion cysts: 1-3  Diffuse (trunk, feace,  extremities) epidermal inclusion cysts: 3-10  Regular phenotype  -sites not commented on demonstrate normal findings.

## 2018-05-05 NOTE — Unmapped (Signed)
Contacted patient with result via MyChart on 05/05/18  No tracking

## 2018-05-10 NOTE — Unmapped (Signed)
Patient was on a work call at the time of our call    [facility] Refill Coordination Note    Specialty Medication(s) to be Shipped:   Inflammatory Disorders: Humira    Other medication(s) to be shipped: n/a     Kelly Norton, DOB: 1976-01-09  Phone: [phone] (home)       All above HIPAA information was verified with patient.     Completed refill call assessment today to schedule patient's medication shipment from the [facility] [phone]).       Specialty medication(s) and dose(s) confirmed: Regimen is correct and unchanged.   Changes to medications: Kelly Norton reports no changes reported at this time.  Changes to insurance: No  Questions for the pharmacist: No    The patient will receive a drug information handout for each medication shipped and additional FDA Medication Guides as required.      DISEASE/MEDICATION-SPECIFIC INFORMATION        For Inflammatory disorders patients on injectable medications: Patient currently has 2 doses left.  Next injection is scheduled for 11/21 (thursdays).    ADHERENCE     No missed doses reported at this time       [facility]     Shipping address confirmed in Epic.     Delivery Scheduled: Yes, Expected medication delivery date: 11/22 via UPS or courier.     Medication will be delivered via Same Day Courier to the home address in Epic WAM.    Kelly Norton   [facility] Specialty Technician

## 2018-05-14 MED FILL — HUMIRA PEN CITRATE FREE 40 MG/0.4 ML: 28 days supply | Qty: 4 | Fill #3

## 2018-05-14 MED FILL — HUMIRA PEN CITRATE FREE 40 MG/0.4 ML: 28 days supply | Qty: 4 | Fill #3 | Status: AC

## 2018-06-08 NOTE — Unmapped (Signed)
[facility] Refill Coordination Note  Specialty Medication(s): Humira CF 40 mg  Additional Medications shipped: none    Kelly Norton, DOB: 1975/08/27  Phone: [phone] (home) , Alternate phone contact: N/A  Phone or address changes today?: No  All above HIPAA information was verified with patient.  Shipping Address: [address]  [place] [address]   Insurance changes? No    Completed refill call assessment today to schedule patient's medication shipment from the [facility] [phone]).      Confirmed the medication and dosage are correct and have not changed: Yes, regimen is correct and unchanged.    Confirmed patient started or stopped the following medications in the past month:  No, there are no changes reported at this time.    Are you tolerating your medication?:  Kelly Norton reports tolerating the medication.    ADHERENCE.    Did you miss any doses in the past 4 weeks? No missed doses reported.    FINANCIAL/SHIPPING    Delivery Scheduled: Yes, Expected medication delivery date: 06/11/18     Medication will be delivered via Same Day Courier to the home address in [facility].    The patient will receive a drug information handout for each medication shipped and additional FDA Medication Guides as required.      Kelly Norton did not have any additional questions at this time.    We will follow up with patient monthly for standard refill processing and delivery.      Thank you,  Verdia Kuba   [facility] Specialty Pharmacist

## 2018-06-11 MED FILL — HUMIRA PEN CITRATE FREE 40 MG/0.4 ML: 28 days supply | Qty: 4 | Fill #4

## 2018-06-11 MED FILL — HUMIRA PEN CITRATE FREE 40 MG/0.4 ML: 28 days supply | Qty: 4 | Fill #4 | Status: AC

## 2018-07-08 NOTE — Unmapped (Signed)
[facility] Refill and Clinical Coordination Note  Medication(s): Sanita Estrada, DOB: [birth year]  Phone: [phone] (home) , Alternate phone contact: N/A  Shipping address: [address]  [place] [address]  Phone or address changes today?: No  All above HIPAA information verified.  Insurance changes? No    Completed refill and clinical call assessment today to schedule patient's medication shipment from the [facility] [phone]).      MEDICATION RECONCILIATION    Confirmed the medication and dosage are correct and have not changed: Yes, regimen is correct and unchanged.    Were there any changes to your medication(s) in the past month:  No, there are no changes reported at this time.    ADHERENCE    Is this medicine transplant or covered by Medicare Part B? No.    id you miss any doses in the past 4 weeks? No missed doses reported.  Adherence counseling provided? Not needed     SIDE EFFECT MANAGEMENT    Are you tolerating your medication?:  Maie reports tolerating the medication.  Side effect management discussed: None      Therapy is appropriate and should be continued.    Evidence of clinical benefit: See Epic note from 05/03/18      FINANCIAL/SHIPPING    Delivery Scheduled: Yes, Expected medication delivery date: Friday, Jan 24     Medication will be delivered via Same Day Courier to the home address in [place].    Additional medications refilled: No additional medications/refills needed at this time.    The patient will receive a drug information handout for each medication shipped and additional FDA Medication Guides as required.      Anaelle did not have any additional questions at this time.    Delivery address confirmed in Epic.     We will follow up with patient monthly for standard refill processing and delivery.      Thank you,  Tawanna Solo [facility] Specialty Pharmacist

## 2018-07-09 MED ORDER — ADALIMUMAB PEN CITRATE FREE 40 MG/0.4 ML
PEN_INJECTOR | SUBCUTANEOUS | 11 refills | 0.00000 days | Status: CP
Start: 2018-07-09 — End: 2019-07-09

## 2018-08-10 ENCOUNTER — Other Ambulatory Visit: Payer: Self-pay | Admitting: Obstetrics and Gynecology

## 2018-08-10 DIAGNOSIS — Z1231 Encounter for screening mammogram for malignant neoplasm of breast: Secondary | ICD-10-CM

## 2018-08-31 ENCOUNTER — Ambulatory Visit
Admission: RE | Admit: 2018-08-31 | Discharge: 2018-08-31 | Disposition: A | Discharge status: Home / Self Care | Payer: Commercial Managed Care - PPO | Source: Ambulatory Visit | Attending: Obstetrics and Gynecology | Admitting: Obstetrics and Gynecology

## 2018-08-31 DIAGNOSIS — Z1231 Encounter for screening mammogram for malignant neoplasm of breast: Secondary | ICD-10-CM

## 2018-10-12 ENCOUNTER — Inpatient Hospital Stay: Payer: Commercial Managed Care - PPO | Attending: Oncology | Admitting: Oncology

## 2018-10-12 ENCOUNTER — Other Ambulatory Visit: Payer: Self-pay

## 2018-10-12 ENCOUNTER — Encounter: Payer: Self-pay | Admitting: Oncology

## 2018-10-12 DIAGNOSIS — D509 Iron deficiency anemia, unspecified: Secondary | ICD-10-CM

## 2018-10-12 DIAGNOSIS — Z79899 Other long term (current) drug therapy: Secondary | ICD-10-CM | POA: Insufficient documentation

## 2018-10-12 DIAGNOSIS — D751 Secondary polycythemia: Secondary | ICD-10-CM | POA: Insufficient documentation

## 2018-10-12 NOTE — Progress Notes (Signed)
She is chronic constipation and when she is tried iron pills in past and it makes her constipation worse so she stopped taking them. She is tired all the time.

## 2018-10-13 ENCOUNTER — Encounter: Payer: Self-pay | Admitting: Oncology

## 2018-10-13 NOTE — Progress Notes (Signed)
I connected with Shannon Escobar on 10/13/18 at  1:30 PM EDT by video enabled telemedicine visit and verified that I am speaking with the correct person using two identifiers.   I discussed the limitations, risks, security and privacy concerns of performing an evaluation and management service by telemedicine and the availability of in-person appointments. I also discussed with the patient that there may be a patient responsible charge related to this service. The patient expressed understanding and agreed to proceed.  Other persons participating in the visit and their role in the encounter:  none  Patient's location:  home Provider's location:  [facility]  Reason for referral: Iron deficiency anemia Referring provider Dr. Feliberto Gottron   History of present illness: Patient is a 43 year old African-American female who has been referred to [place] for anemia.Most recent CBC from 09/20/2018 showed white count of 7.3, H&H of 6.5/25.7 with an MCV of 58.8 and a platelet count of 343.  CMP was within normal limits.  TSH was normal.  Iron study showed a ferritin of 1.  Of note patient also had a CBC back in August 2019 which showed a white count of 6.7, H&H of 6.8/25.1 with an MCV of 57 and a platelet count of 396.  Patient has a history of fibroids and recent ultrasound showed she had 6 fibroids ranging from 2 to 5 cm.  Patient does report heavy menstrual bleeding.  Typically her cycles last for 5 days but the first 2 to 3 days are heavy where she has to change her sanitary pad every hour or 2.  She has had one episode of rectal bleeding which was self-limited.  She currently denies any blood in her stool or urine.  Denies any dark melanotic stools.  Denies any consistent use of NSAIDs.  Denies any family history of colon cancer. Reports fatigue and cravings for ice.   Review of Systems  Constitutional: Positive for malaise/fatigue. Negative for chills, fever and weight loss.  HENT: Negative for  congestion, ear discharge and nosebleeds.   Eyes: Negative for blurred vision.  Respiratory: Negative for cough, hemoptysis, sputum production, shortness of breath and wheezing.   Cardiovascular: Negative for chest pain, palpitations, orthopnea and claudication.  Gastrointestinal: Negative for abdominal pain, blood in stool, constipation, diarrhea, heartburn, melena, nausea and vomiting.  Genitourinary: Negative for dysuria, flank pain, frequency, hematuria and urgency.  Musculoskeletal: Negative for back pain, joint pain and myalgias.  Skin: Negative for rash.  Neurological: Negative for dizziness, tingling, focal weakness, seizures, weakness and headaches.  Endo/Heme/Allergies: Does not bruise/bleed easily.       Heavy menstrual bleeding  Psychiatric/Behavioral: Negative for depression and suicidal ideas. The patient does not have insomnia.     Allergies  Allergen Reactions  . Penicillins     Past Medical History:  Diagnosis Date  . Anemia   . Diabetes mellitus without complication (HCC)    had gestational diabetes    Past Surgical History:  Procedure Laterality Date  . ABDOMINAL SURGERY    . BREAST CYST ASPIRATION Right 2011   abcess drained  . CESAREAN SECTION     x2  . CHOLECYSTECTOMY    . TUBAL LIGATION      Social History   Socioeconomic History  . Marital status: Single    Spouse name: Not on file  . Number of children: Not on file  . Years of education: Not on file  . Highest education level: Not on file  Occupational History  . Not on file  Social Needs  . Financial resource strain: Not on file  . Food insecurity:    Worry: Not on file    Inability: Not on file  . Transportation needs:    Medical: Not on file    Non-medical: Not on file  Tobacco Use  . Smoking status: Never Smoker  . Smokeless tobacco: Never Used  Substance and Sexual Activity  . Alcohol use: No    Comment: occassional in social  . Drug use: Never  . Sexual activity: Yes   Lifestyle  . Physical activity:    Days per week: Not on file    Minutes per session: Not on file  . Stress: Not on file  Relationships  . Social connections:    Talks on phone: Not on file    Gets together: Not on file    Attends religious service: Not on file    Active member of club or organization: Not on file    Attends meetings of clubs or organizations: Not on file    Relationship status: Not on file  . Intimate partner violence:    Fear of current or ex partner: Not on file    Emotionally abused: Not on file    Physically abused: Not on file    Forced sexual activity: Not on file  Other Topics Concern  . Not on file  Social History Narrative  . Not on file    Family History  Problem Relation Age of Onset  . Diabetes Mother      Current Outpatient Medications:  .  Adalimumab (HUMIRA PEN) 40 MG/0.4ML PNKT, Inject 1 Dose into the skin once a week., Disp: , Rfl:  .  metFORMIN (GLUCOPHAGE) 500 MG tablet, Take by mouth daily with breakfast., Disp: , Rfl:  .  ibuprofen (ADVIL,MOTRIN) 800 MG tablet, Take 1 tablet (800 mg total) by mouth every 8 (eight) hours as needed for moderate pain. (Patient not taking: Reported on 10/12/2018), Disp: 15 tablet, Rfl: 0  No results found.  No images are attached to the encounter.   CMP Latest Ref Rng & Units 11/19/2016  Glucose 65 - 99 mg/dL 161(W145(H)  BUN 6 - 20 mg/dL 10  Creatinine 9.600.44 - 4.541.00 mg/dL 0.980.77  Sodium 119135 - 147145 mmol/L 138  Potassium 3.5 - 5.1 mmol/L 3.4(L)  Chloride 101 - 111 mmol/L 105  CO2 22 - 32 mmol/L 26  Calcium 8.9 - 10.3 mg/dL 8.9  Total Protein 6.5 - 8.1 g/dL 8.2(N8.7(H)  Total Bilirubin 0.3 - 1.2 mg/dL 0.5  Alkaline Phos 38 - 126 U/L 111  AST 15 - 41 U/L 27  ALT 14 - 54 U/L 35   CBC Latest Ref Rng & Units 11/19/2016  WBC 3.6 - 11.0 K/uL 9.6  Hemoglobin 12.0 - 16.0 g/dL 5.6(O8.4(L)  Hematocrit 13.035.0 - 47.0 % 27.9(L)  Platelets 150 - 440 K/uL 387     Observation/objective: Appears no no acute distress over the  video visit today.  Breathing is nonlabored  Assessment and plan: Patient is a 43 year old female referred for severe iron deficiency anemia  Patient has had severe iron deficiency at least over the last 1 year and her hemoglobin on 2 occasions has been less than 7.  We will check CBC with differential, CMP, ferritin and iron studies, B12 and folate, reticulocyte count, haptoglobin in 2 days time.  Also check urinalysis, stool H. pylori antigen and celiac disease panel.  Given that she has significant iron deficiency I would recommend 2 doses of  Feraheme 510 mg IV weekly x2.  Discussed risks and benefits of IV iron including all but not limited to headache, leg swelling and possible risk of infusion reaction.  Patient understands and agrees to proceed as planned.  She will receive first dose of Feraheme in 2 days and the second dose next week.  With regards to etiology of her iron deficiency anemia: I suspect this is secondary to heavy menstrual bleeding which needs to be addressed by Dr. Feliberto Gottron.  Given that her anemia is severe she may need a hysterectomy.  Giving her IV iron will only temporarily make her iron deficiency better but as long as she continues to have menorrhagia she will continue to remain anemic.  Given her history of 1 episode of rectal bleeding she also requires a complete GI work-up including endoscopy colonoscopy plus minus capsule endoscopy.  A negative stool occult cards does not obviate the need for GI work-up.  Follow-up instructions: I will see her back in 2 months time with a repeat CBC ferritin and iron studies.  If she continues to be iron deficient I will refer her to GI at that time  I discussed the assessment and treatment plan with the patient. The patient was provided an opportunity to ask questions and all were answered. The patient agreed with the plan and demonstrated an understanding of the instructions.   The patient was advised to call back or seek an  in-person evaluation if the symptoms worsen or if the condition fails to improve as anticipated.  I provided 30 minutes of face-to-face video visit time during this encounter, and > 50% was spent counseling as documented under my assessment & plan.  Visit Diagnosis: 1. Iron deficiency anemia, unspecified iron deficiency anemia type     Dr. Owens Shark, MD, MPH [facility] at [facility] [phone] 10/13/2018 8:39 AM

## 2018-10-14 ENCOUNTER — Other Ambulatory Visit: Payer: Self-pay

## 2018-10-15 ENCOUNTER — Other Ambulatory Visit: Payer: Self-pay

## 2018-10-15 ENCOUNTER — Inpatient Hospital Stay: Payer: Commercial Managed Care - PPO

## 2018-10-15 VITALS — BP 111/70 | HR 76 | Temp 96.4°F | Resp 18

## 2018-10-15 DIAGNOSIS — D509 Iron deficiency anemia, unspecified: Secondary | ICD-10-CM | POA: Diagnosis not present

## 2018-10-15 DIAGNOSIS — Z79899 Other long term (current) drug therapy: Secondary | ICD-10-CM | POA: Diagnosis not present

## 2018-10-15 MED ORDER — SODIUM CHLORIDE 0.9 % IV SOLN
Freq: Once | INTRAVENOUS | Status: AC
  Administered 2018-10-15: 13:00:00 via INTRAVENOUS
  Filled 2018-10-15: qty 250

## 2018-10-15 MED ORDER — SODIUM CHLORIDE 0.9 % IV SOLN
510.0000 mg | Freq: Once | INTRAVENOUS | Status: AC
  Administered 2018-10-15: 510 mg via INTRAVENOUS
  Filled 2018-10-15: qty 17

## 2018-10-22 ENCOUNTER — Other Ambulatory Visit: Payer: Self-pay

## 2018-10-22 ENCOUNTER — Inpatient Hospital Stay: Payer: Commercial Managed Care - PPO | Attending: Oncology

## 2018-10-22 VITALS — BP 121/80 | HR 71 | Temp 98.4°F | Resp 18

## 2018-10-22 DIAGNOSIS — Z79899 Other long term (current) drug therapy: Secondary | ICD-10-CM | POA: Insufficient documentation

## 2018-10-22 DIAGNOSIS — D509 Iron deficiency anemia, unspecified: Secondary | ICD-10-CM | POA: Diagnosis not present

## 2018-10-22 MED ORDER — SODIUM CHLORIDE 0.9 % IV SOLN
Freq: Once | INTRAVENOUS | Status: AC
  Administered 2018-10-22: 14:00:00 via INTRAVENOUS
  Filled 2018-10-22: qty 250

## 2018-10-22 MED ORDER — SODIUM CHLORIDE 0.9 % IV SOLN
510.0000 mg | Freq: Once | INTRAVENOUS | Status: AC
  Administered 2018-10-22: 14:00:00 510 mg via INTRAVENOUS
  Filled 2018-10-22: qty 17

## 2018-12-13 ENCOUNTER — Ambulatory Visit: Payer: Commercial Managed Care - PPO | Admitting: Oncology

## 2018-12-13 ENCOUNTER — Other Ambulatory Visit: Payer: Commercial Managed Care - PPO

## 2018-12-14 ENCOUNTER — Inpatient Hospital Stay (HOSPITAL_BASED_OUTPATIENT_CLINIC_OR_DEPARTMENT_OTHER): Payer: Commercial Managed Care - PPO | Admitting: Oncology

## 2018-12-14 ENCOUNTER — Encounter: Payer: Self-pay | Admitting: Oncology

## 2018-12-14 ENCOUNTER — Other Ambulatory Visit: Payer: Self-pay

## 2018-12-14 ENCOUNTER — Telehealth: Payer: Self-pay | Admitting: *Deleted

## 2018-12-14 ENCOUNTER — Inpatient Hospital Stay: Payer: Commercial Managed Care - PPO | Attending: Oncology

## 2018-12-14 ENCOUNTER — Encounter: Payer: Self-pay | Admitting: *Deleted

## 2018-12-14 VITALS — BP 122/87 | HR 72 | Temp 98.3°F | Resp 20 | Ht 67.0 in | Wt 237.0 lb

## 2018-12-14 DIAGNOSIS — D509 Iron deficiency anemia, unspecified: Secondary | ICD-10-CM | POA: Diagnosis present

## 2018-12-14 DIAGNOSIS — Z79899 Other long term (current) drug therapy: Secondary | ICD-10-CM | POA: Insufficient documentation

## 2018-12-14 DIAGNOSIS — N920 Excessive and frequent menstruation with regular cycle: Secondary | ICD-10-CM | POA: Diagnosis not present

## 2018-12-14 DIAGNOSIS — E119 Type 2 diabetes mellitus without complications: Secondary | ICD-10-CM | POA: Insufficient documentation

## 2018-12-14 DIAGNOSIS — Z7984 Long term (current) use of oral hypoglycemic drugs: Secondary | ICD-10-CM | POA: Diagnosis not present

## 2018-12-14 LAB — IRON AND TIBC
Iron: 19 ug/dL — ABNORMAL LOW (ref 28–170)
Saturation Ratios: 5 % — ABNORMAL LOW (ref 10.4–31.8)
TIBC: 409 ug/dL (ref 250–450)
UIBC: 390 ug/dL

## 2018-12-14 LAB — URINALYSIS, COMPLETE (UACMP) WITH MICROSCOPIC
Bilirubin Urine: NEGATIVE
Glucose, UA: NEGATIVE mg/dL
Ketones, ur: NEGATIVE mg/dL
Leukocytes,Ua: NEGATIVE
Nitrite: NEGATIVE
Protein, ur: NEGATIVE mg/dL
Specific Gravity, Urine: 1.015 (ref 1.005–1.030)
pH: 5 (ref 5.0–8.0)

## 2018-12-14 LAB — CBC WITH DIFFERENTIAL/PLATELET
Abs Immature Granulocytes: 0.02 10*3/uL (ref 0.00–0.07)
Basophils Absolute: 0 10*3/uL (ref 0.0–0.1)
Basophils Relative: 1 %
Eosinophils Absolute: 0.1 10*3/uL (ref 0.0–0.5)
Eosinophils Relative: 2 %
HCT: 35.2 % — ABNORMAL LOW (ref 36.0–46.0)
Hemoglobin: 10.7 g/dL — ABNORMAL LOW (ref 12.0–15.0)
Immature Granulocytes: 0 %
Lymphocytes Relative: 19 %
Lymphs Abs: 1.2 10*3/uL (ref 0.7–4.0)
MCH: 23.7 pg — ABNORMAL LOW (ref 26.0–34.0)
MCHC: 30.4 g/dL (ref 30.0–36.0)
MCV: 77.9 fL — ABNORMAL LOW (ref 80.0–100.0)
Monocytes Absolute: 0.3 10*3/uL (ref 0.1–1.0)
Monocytes Relative: 6 %
Neutro Abs: 4.5 10*3/uL (ref 1.7–7.7)
Neutrophils Relative %: 72 %
Platelets: 315 10*3/uL (ref 150–400)
RBC: 4.52 MIL/uL (ref 3.87–5.11)
RDW: 27.9 % — ABNORMAL HIGH (ref 11.5–15.5)
WBC: 6.2 10*3/uL (ref 4.0–10.5)
nRBC: 0 % (ref 0.0–0.2)

## 2018-12-14 LAB — FERRITIN: Ferritin: 4 ng/mL — ABNORMAL LOW (ref 11–307)

## 2018-12-14 LAB — VITAMIN B12: Vitamin B-12: 353 pg/mL (ref 180–914)

## 2018-12-14 LAB — RETICULOCYTES
Immature Retic Fract: 18.3 % — ABNORMAL HIGH (ref 2.3–15.9)
RBC.: 4.52 MIL/uL (ref 3.87–5.11)
Retic Count, Absolute: 39.3 10*3/uL (ref 19.0–186.0)
Retic Ct Pct: 0.9 % (ref 0.4–3.1)

## 2018-12-14 LAB — FOLATE: Folate: 7.1 ng/mL (ref >5.9)

## 2018-12-14 IMAGING — US US BREAST CYST ASPIRATION 1ST CYST
1 series · 4 of 4 positions shown · non-contrast
Comparison: Diagnostic left breast ultrasound earlier today and
02/20/2017.

CLINICAL DATA: Recurrent abscess in upper left breast at far
posterior depth. Patient is on day 7 of doxycycline therapy.

EXAM:
ULTRASOUND GUIDED LEFT BREAST ABSCESS ASPIRATION

[Series 1: us breast cyst aspiration 1st cyst · 0.07mm/px · 4 of 4 slices shown]
[im 1/4]
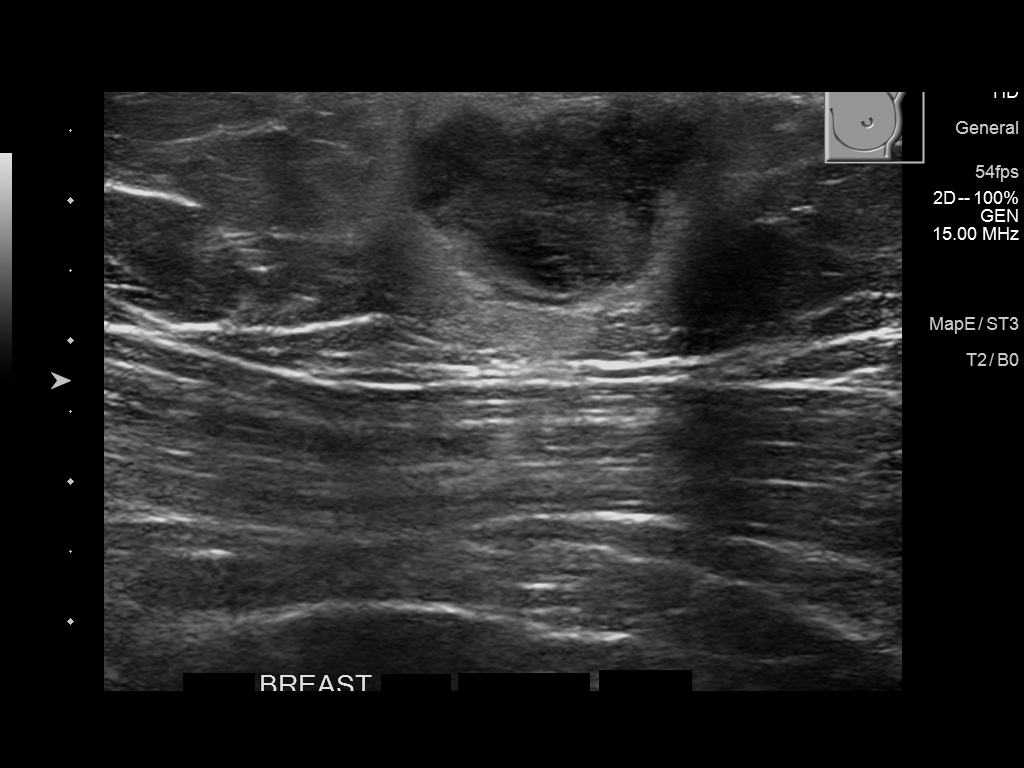
[im 2/4]
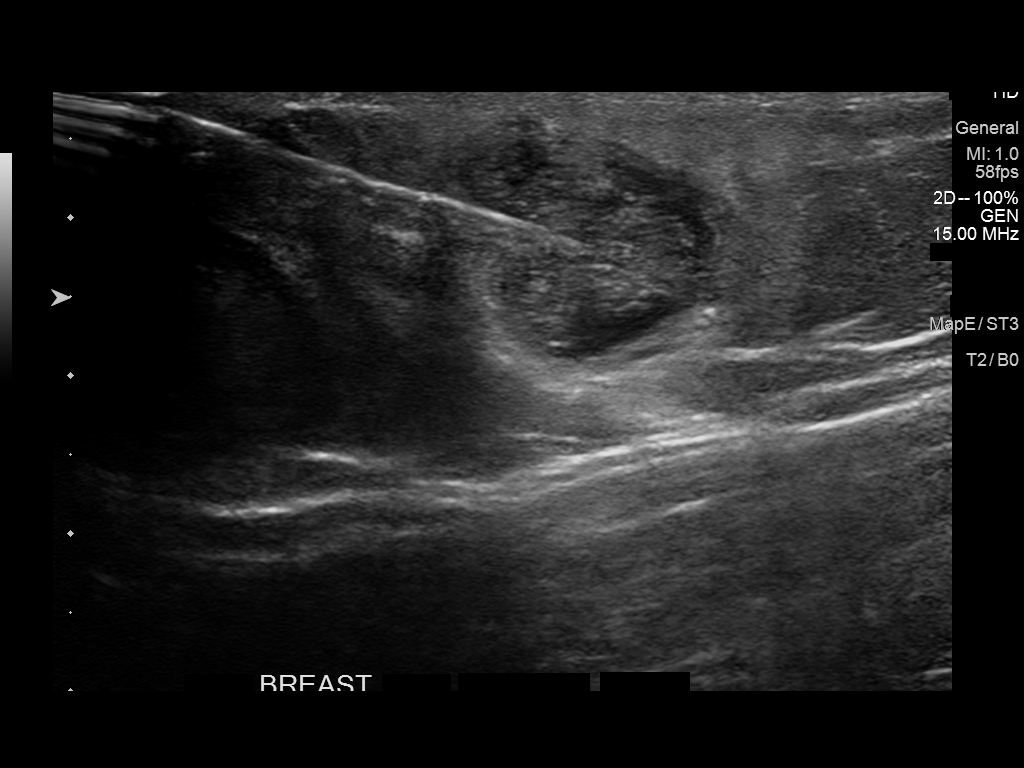
[im 3/4]
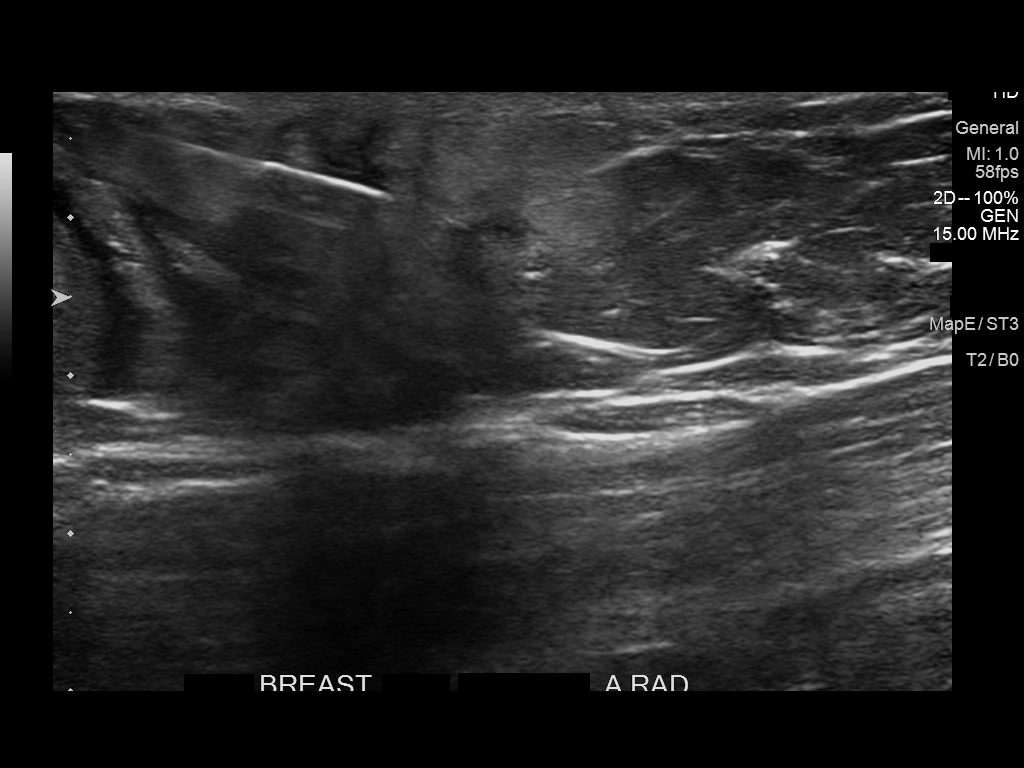
[im 4/4]
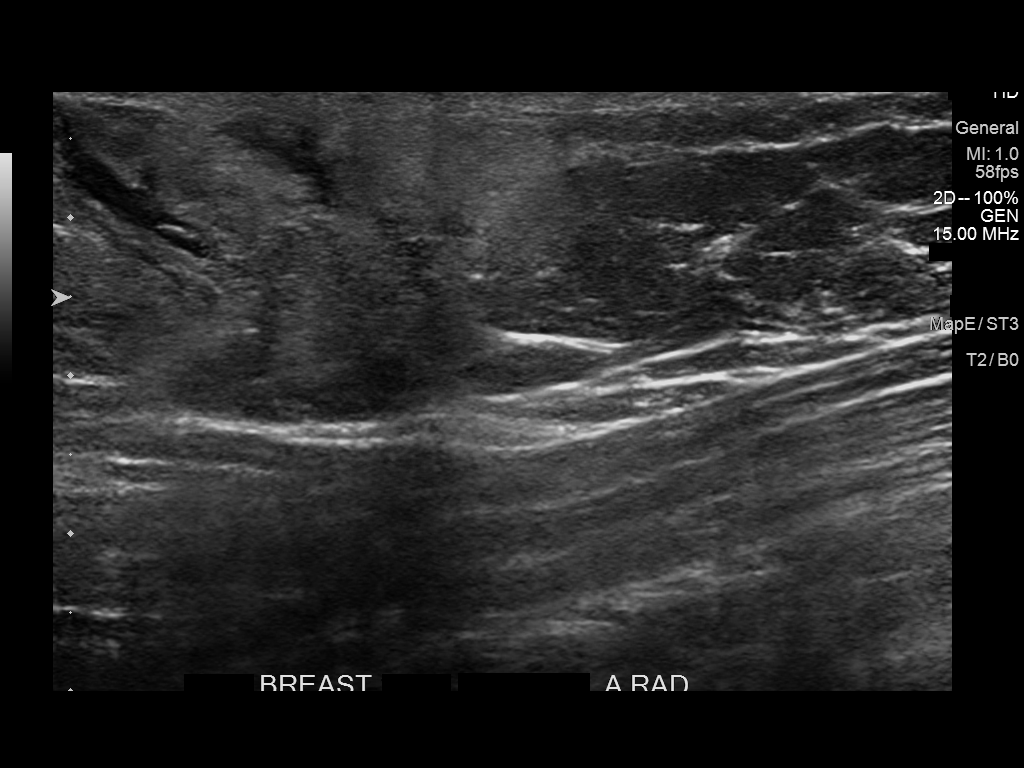

[4 of 4 positions shown; findings below may reference images not displayed]

PROCEDURE:
Using sterile technique with chlorhexidine as skin antisepsis, 1%
lidocaine as local anesthetic, under direct ultrasound
visualization, a 13 gauge introducer needle was advanced into the
subcutaneous abscess in the upper left breast at far posterior depth
at the 11 to 12 o'clock position approximately 22 cm from the
nipple. Approximately 3 cc of bloody purulent fluid were aspirated.
The abscess completely resolved with aspiration.
IMPRESSION: Ultrasound-guided aspiration of an abscess in the upper left breast
at far posterior depth. The abscess completely resolved with
aspiration. No apparent complications.

RECOMMENDATIONS:
1. The patient was instructed to complete the entire course of
antibiotic therapy.
2. Follow-up left breast ultrasound and possible re-aspiration in 1
week.

## 2018-12-14 NOTE — Telephone Encounter (Signed)
-----   Message from Sindy Guadeloupe, MD sent at 12/14/2018 11:57 AM EDT ----- Needs 2 more doses of feraheme

## 2018-12-14 NOTE — Telephone Encounter (Signed)
Called the patient and let her know that fer ferritin was 4 and she will need 2 more doses of feraheme. The patient is agreeable and she states she can come on tues and fri because she is off work on those days.  She states that she is having a GYn appt on this Friday so we will start feraheme next week. I told pt that I will send a message to the scheduler and she will call pt. Back with date and times for 2 dose of feraheme with 1 week apart.

## 2018-12-15 ENCOUNTER — Other Ambulatory Visit: Payer: Self-pay | Admitting: *Deleted

## 2018-12-15 DIAGNOSIS — D509 Iron deficiency anemia, unspecified: Secondary | ICD-10-CM

## 2018-12-15 LAB — CELIAC DISEASE PANEL
Endomysial Ab, IgA: NEGATIVE
IgA: 483 mg/dL — ABNORMAL HIGH (ref 87–352)
Tissue Transglutaminase Ab, IgA: <2 U/mL (ref 0–3)

## 2018-12-15 LAB — HAPTOGLOBIN: Haptoglobin: 147 mg/dL (ref 42–296)

## 2018-12-16 NOTE — Progress Notes (Signed)
Hematology/Oncology Consult note [facility]  [phone] [phone]  Patient Care Team: Schermerhorn, Gwen Her, MD as PCP - General (Obstetrics and Gynecology)   Name of the patient: Shannon Escobar  [id number]  09-27-1975   Date of visit: 12/16/18  Diagnosis-iron deficiency anemia possibly secondary to menorrhagia  Chief complaint/ Reason for visit-routine follow-up of iron deficiency anemia  Heme/Onc history: Patient is a 43 year old African-American female who has been referred to [place] for anemia.Most recent CBC from 09/20/2018 showed white count of 7.3, H&H of 6.5/25.7 with an MCV of 58.8 and a platelet count of 343.  CMP was within normal limits.  TSH was normal.  Iron study showed a ferritin of 1.  Of note patient also had a CBC back in August 2019 which showed a white count of 6.7, H&H of 6.8/25.1 with an MCV of 57 and a platelet count of 396.  Patient has a history of fibroids and recent ultrasound showed she had 6 fibroids ranging from 2 to 5 cm.  Patient does report heavy menstrual bleeding.  Typically her cycles last for 5 days but the first 2 to 3 days are heavy where she has to change her sanitary pad every hour or 2.  She has had one episode of rectal bleeding which was self-limited.  She currently denies any blood in her stool or urine.  Denies any dark melanotic stools.  Denies any consistent use of NSAIDs.  Denies any family history of colon cancer.   Interval history-patient received 2 doses of Feraheme which he tolerated well without any significant side effects.  She does report significant improvement in her energy levels as well as decreased craving for ice.  She continues to have heavy menstrual cycles and will be seeing Dr. Ouida Sills later this week and get an IUD placed.  ECOG PS- 0 Pain scale- 0   Review of systems- Review of Systems  Constitutional: Negative for chills, fever, malaise/fatigue and weight loss.  HENT:  Negative for congestion, ear discharge and nosebleeds.   Eyes: Negative for blurred vision.  Respiratory: Negative for cough, hemoptysis, sputum production, shortness of breath and wheezing.   Cardiovascular: Negative for chest pain, palpitations, orthopnea and claudication.  Gastrointestinal: Negative for abdominal pain, blood in stool, constipation, diarrhea, heartburn, melena, nausea and vomiting.  Genitourinary: Negative for dysuria, flank pain, frequency, hematuria and urgency.  Musculoskeletal: Negative for back pain, joint pain and myalgias.  Skin: Negative for rash.  Neurological: Negative for dizziness, tingling, focal weakness, seizures, weakness and headaches.  Endo/Heme/Allergies: Does not bruise/bleed easily.  Psychiatric/Behavioral: Negative for depression and suicidal ideas. The patient does not have insomnia.       Allergies  Allergen Reactions  . Penicillins      Past Medical History:  Diagnosis Date  . Anemia   . Diabetes mellitus without complication ([place])    had gestational diabetes     Past Surgical History:  Procedure Laterality Date  . ABDOMINAL SURGERY    . BREAST CYST ASPIRATION Right 2011   abcess drained  . CESAREAN SECTION     x2  . CHOLECYSTECTOMY    . TUBAL LIGATION      Social History   Socioeconomic History  . Marital status: Single    Spouse name: Not on file  . Number of children: Not on file  . Years of education: Not on file  . Highest education level: Not on file  Occupational History  . Not on file  Social Needs  . Financial resource strain: Not on file  . Food insecurity    Worry: Not on file    Inability: Not on file  . Transportation needs    Medical: Not on file    Non-medical: Not on file  Tobacco Use  . Smoking status: Never Smoker  . Smokeless tobacco: Never Used  Substance and Sexual Activity  . Alcohol use: No    Comment: occassional in social  . Drug use: Never  . Sexual activity: Yes  Lifestyle  .  Physical activity    Days per week: Not on file    Minutes per session: Not on file  . Stress: Not on file  Relationships  . Social Musicianconnections    Talks on phone: Not on file    Gets together: Not on file    Attends religious service: Not on file    Active member of club or organization: Not on file    Attends meetings of clubs or organizations: Not on file    Relationship status: Not on file  . Intimate partner violence    Fear of current or ex partner: Not on file    Emotionally abused: Not on file    Physically abused: Not on file    Forced sexual activity: Not on file  Other Topics Concern  . Not on file  Social History Narrative  . Not on file    Family History  Problem Relation Age of Onset  . Diabetes Mother      Current Outpatient Medications:  .  Adalimumab (HUMIRA PEN) 40 MG/0.4ML PNKT, Inject 1 Dose into the skin once a week., Disp: , Rfl:  .  ibuprofen (ADVIL,MOTRIN) 800 MG tablet, Take 1 tablet (800 mg total) by mouth every 8 (eight) hours as needed for moderate pain. (Patient not taking: Reported on 10/12/2018), Disp: 15 tablet, Rfl: 0 .  metFORMIN (GLUCOPHAGE) 500 MG tablet, Take by mouth daily with breakfast., Disp: , Rfl:   Physical exam:  Vitals:   12/14/18 1006  BP: 122/87  Pulse: 72  Resp: 20  Temp: 98.3 F (36.8 C)  TempSrc: Tympanic  Weight: 237 lb (107.5 kg)  Height: 5\' 7"  (1.702 m)   Physical Exam Constitutional:      General: She is not in acute distress. HENT:     Head: Normocephalic and atraumatic.  Eyes:     Pupils: Pupils are equal, round, and reactive to light.  Neck:     Musculoskeletal: Normal range of motion.  Cardiovascular:     Rate and Rhythm: Normal rate and regular rhythm.     Heart sounds: Normal heart sounds.  Pulmonary:     Effort: Pulmonary effort is normal.     Breath sounds: Normal breath sounds.  Abdominal:     General: Bowel sounds are normal.     Palpations: Abdomen is soft.  Skin:    General: Skin is warm  and dry.  Neurological:     Mental Status: She is alert and oriented to person, place, and time.      CMP Latest Ref Rng & Units 11/19/2016  Glucose 65 - 99 mg/dL 161(W145(H)  BUN 6 - 20 mg/dL 10  Creatinine 9.600.44 - 4.541.00 mg/dL 0.980.77  Sodium 119135 - 147145 mmol/L 138  Potassium 3.5 - 5.1 mmol/L 3.4(L)  Chloride 101 - 111 mmol/L 105  CO2 22 - 32 mmol/L 26  Calcium 8.9 - 10.3 mg/dL 8.9  Total Protein 6.5 - 8.1 g/dL 8.2(N8.7(H)  Total Bilirubin 0.3 - 1.2 mg/dL 0.5  Alkaline Phos 38 - 126 U/L 111  AST 15 - 41 U/L 27  ALT 14 - 54 U/L 35   CBC Latest Ref Rng & Units 12/14/2018  WBC 4.0 - 10.5 K/uL 6.2  Hemoglobin 12.0 - 15.0 g/dL 10.7(L)  Hematocrit 36.0 - 46.0 % 35.2(L)  Platelets 150 - 400 K/uL 315     Assessment and plan- Patient is a 43 y.o. female with iron deficiency anemia possibly secondary to menorrhagia  Patient hemoglobin did significantly improve from 7.3-10 point 2:07 doses of Feraheme.  Her iron studies today still show that she is iron deficient.  She is yet to start out her menorrhagia and hopefully after placement of IUD her menstrual cycles will slow down.  Ferritin is still low at 1:04 doses of Feraheme.  I will therefore go ahead and give her 2 more doses at this time.  Repeat CBC ferritin and iron studies in 2 in 4 months and I will see her back in 4 months     Visit Diagnosis 1. Iron deficiency anemia, unspecified iron deficiency anemia type      Dr. Owens SharkArchana Sona Nations, MD, MPH [facility] [phone] 12/16/2018 8:45 AM

## 2018-12-17 ENCOUNTER — Other Ambulatory Visit: Payer: Self-pay

## 2018-12-17 DIAGNOSIS — D509 Iron deficiency anemia, unspecified: Secondary | ICD-10-CM

## 2018-12-19 LAB — H. PYLORI ANTIGEN, STOOL: H. Pylori Stool Ag, Eia: NEGATIVE

## 2018-12-20 ENCOUNTER — Other Ambulatory Visit: Payer: Self-pay

## 2018-12-21 ENCOUNTER — Inpatient Hospital Stay: Payer: Commercial Managed Care - PPO

## 2018-12-21 ENCOUNTER — Other Ambulatory Visit: Payer: Self-pay

## 2018-12-21 VITALS — BP 124/84 | HR 72 | Temp 98.8°F | Resp 18

## 2018-12-21 DIAGNOSIS — D509 Iron deficiency anemia, unspecified: Secondary | ICD-10-CM

## 2018-12-21 MED ORDER — SODIUM CHLORIDE 0.9 % IV SOLN
Freq: Once | INTRAVENOUS | Status: AC
  Administered 2018-12-21: 14:00:00 via INTRAVENOUS
  Filled 2018-12-21: qty 250

## 2018-12-21 MED ORDER — SODIUM CHLORIDE 0.9 % IV SOLN
510.0000 mg | INTRAVENOUS | Status: DC
  Administered 2018-12-21: 14:00:00 510 mg via INTRAVENOUS
  Filled 2018-12-21: qty 17

## 2018-12-22 ENCOUNTER — Encounter: Payer: Self-pay | Admitting: Oncology

## 2018-12-28 ENCOUNTER — Inpatient Hospital Stay: Payer: Commercial Managed Care - PPO | Attending: Oncology

## 2018-12-28 ENCOUNTER — Other Ambulatory Visit: Payer: Self-pay

## 2018-12-28 VITALS — BP 124/74 | HR 72 | Temp 98.1°F | Resp 18

## 2018-12-28 DIAGNOSIS — Z79899 Other long term (current) drug therapy: Secondary | ICD-10-CM | POA: Insufficient documentation

## 2018-12-28 DIAGNOSIS — D509 Iron deficiency anemia, unspecified: Secondary | ICD-10-CM | POA: Insufficient documentation

## 2018-12-28 MED ORDER — SODIUM CHLORIDE 0.9 % IV SOLN
510.0000 mg | INTRAVENOUS | Status: DC
  Administered 2018-12-28: 510 mg via INTRAVENOUS
  Filled 2018-12-28: qty 17

## 2018-12-28 MED ORDER — SODIUM CHLORIDE 0.9 % IV SOLN
Freq: Once | INTRAVENOUS | Status: AC
  Administered 2018-12-28: 15:00:00 via INTRAVENOUS
  Filled 2018-12-28: qty 250

## 2019-02-14 ENCOUNTER — Other Ambulatory Visit: Payer: Self-pay

## 2019-02-15 ENCOUNTER — Inpatient Hospital Stay: Payer: Commercial Managed Care - PPO

## 2019-02-18 ENCOUNTER — Inpatient Hospital Stay: Payer: Commercial Managed Care - PPO | Attending: Oncology

## 2019-02-24 ENCOUNTER — Other Ambulatory Visit: Payer: Self-pay

## 2019-02-25 ENCOUNTER — Other Ambulatory Visit: Payer: Self-pay

## 2019-02-25 ENCOUNTER — Inpatient Hospital Stay: Payer: Commercial Managed Care - PPO | Attending: Hematology and Oncology

## 2019-02-25 DIAGNOSIS — D509 Iron deficiency anemia, unspecified: Secondary | ICD-10-CM | POA: Insufficient documentation

## 2019-02-25 LAB — CBC WITH DIFFERENTIAL/PLATELET
Abs Immature Granulocytes: 0.01 10*3/uL (ref 0.00–0.07)
Basophils Absolute: 0 10*3/uL (ref 0.0–0.1)
Basophils Relative: 0 %
Eosinophils Absolute: 0.1 10*3/uL (ref 0.0–0.5)
Eosinophils Relative: 2 %
HCT: 41.5 % (ref 36.0–46.0)
Hemoglobin: 13.6 g/dL (ref 12.0–15.0)
Immature Granulocytes: 0 %
Lymphocytes Relative: 15 %
Lymphs Abs: 1.1 10*3/uL (ref 0.7–4.0)
MCH: 27.5 pg (ref 26.0–34.0)
MCHC: 32.8 g/dL (ref 30.0–36.0)
MCV: 84 fL (ref 80.0–100.0)
Monocytes Absolute: 0.3 10*3/uL (ref 0.1–1.0)
Monocytes Relative: 5 %
Neutro Abs: 5.7 10*3/uL (ref 1.7–7.7)
Neutrophils Relative %: 78 %
Platelets: 276 10*3/uL (ref 150–400)
RBC: 4.94 MIL/uL (ref 3.87–5.11)
RDW: 19.1 % — ABNORMAL HIGH (ref 11.5–15.5)
WBC: 7.3 10*3/uL (ref 4.0–10.5)
nRBC: 0 % (ref 0.0–0.2)

## 2019-02-25 LAB — IRON AND TIBC
Iron: 46 ug/dL (ref 28–170)
Saturation Ratios: 14 % (ref 10.4–31.8)
TIBC: 327 ug/dL (ref 250–450)
UIBC: 281 ug/dL

## 2019-02-25 LAB — FERRITIN: Ferritin: 50 ng/mL (ref 11–307)

## 2019-04-08 ENCOUNTER — Encounter: Payer: Self-pay | Admitting: Family Medicine

## 2019-04-08 ENCOUNTER — Other Ambulatory Visit: Payer: Self-pay

## 2019-04-08 ENCOUNTER — Ambulatory Visit: Payer: Commercial Managed Care - PPO | Admitting: Family Medicine

## 2019-04-08 VITALS — BP 112/78 | HR 72 | Temp 97.8°F | Resp 16 | Ht 67.0 in | Wt 247.9 lb

## 2019-04-08 DIAGNOSIS — Z7689 Persons encountering health services in other specified circumstances: Secondary | ICD-10-CM

## 2019-04-08 DIAGNOSIS — D509 Iron deficiency anemia, unspecified: Secondary | ICD-10-CM

## 2019-04-08 DIAGNOSIS — R7303 Prediabetes: Secondary | ICD-10-CM

## 2019-04-08 DIAGNOSIS — L732 Hidradenitis suppurativa: Secondary | ICD-10-CM | POA: Diagnosis not present

## 2019-04-08 DIAGNOSIS — Z114 Encounter for screening for human immunodeficiency virus [HIV]: Secondary | ICD-10-CM

## 2019-04-08 DIAGNOSIS — Z6838 Body mass index (BMI) 38.0-38.9, adult: Secondary | ICD-10-CM

## 2019-04-08 DIAGNOSIS — E119 Type 2 diabetes mellitus without complications: Secondary | ICD-10-CM | POA: Insufficient documentation

## 2019-04-08 DIAGNOSIS — Z13220 Encounter for screening for lipoid disorders: Secondary | ICD-10-CM

## 2019-04-08 DIAGNOSIS — Z1159 Encounter for screening for other viral diseases: Secondary | ICD-10-CM

## 2019-04-08 DIAGNOSIS — M47817 Spondylosis without myelopathy or radiculopathy, lumbosacral region: Secondary | ICD-10-CM

## 2019-04-08 DIAGNOSIS — N920 Excessive and frequent menstruation with regular cycle: Secondary | ICD-10-CM

## 2019-04-08 MED ORDER — SAXENDA 18 MG/3ML ~~LOC~~ SOPN: PEN_INJECTOR | SUBCUTANEOUS | 3 refills | Status: DC

## 2019-04-08 MED ORDER — NOVOFINE 32G X 6 MM MISC: 1.0000 | Freq: Every day | 3 refills | Status: DC

## 2019-04-08 NOTE — Patient Instructions (Signed)
Mediterranean Diet A Mediterranean diet refers to food and lifestyle choices that are based on the traditions of countries located on the [place] Sea. This way of eating has been shown to help prevent certain conditions and improve outcomes for people who have chronic diseases, like kidney disease and heart disease. What are tips for following this plan? Lifestyle  Cook and eat meals together with your family, when possible.  Drink enough fluid to keep your urine clear or pale yellow.  Be physically active every day. This includes: ? Aerobic exercise like running or swimming. ? Leisure activities like gardening, walking, or housework.  Get 7-8 hours of sleep each night.  If recommended by your health care provider, drink red wine in moderation. This means 1 glass a day for nonpregnant women and 2 glasses a day for men. A glass of wine equals 5 oz (150 mL). Reading food labels   Check the serving size of packaged foods. For foods such as rice and pasta, the serving size refers to the amount of cooked product, not dry.  Check the total fat in packaged foods. Avoid foods that have saturated fat or trans fats.  Check the ingredients list for added sugars, such as corn syrup. Shopping  At the grocery store, buy most of your food from the areas near the walls of the store. This includes: ? Fresh fruits and vegetables (produce). ? Grains, beans, nuts, and seeds. Some of these may be available in unpackaged forms or large amounts (in bulk). ? Fresh seafood. ? Poultry and eggs. ? Low-fat dairy products.  Buy whole ingredients instead of prepackaged foods.  Buy fresh fruits and vegetables in-season from local farmers markets.  Buy frozen fruits and vegetables in resealable bags.  If you do not have access to quality fresh seafood, buy precooked frozen shrimp or canned fish, such as tuna, salmon, or sardines.  Buy small amounts of raw or cooked vegetables, salads, or olives from  the deli or salad bar at your store.  Stock your pantry so you always have certain foods on hand, such as olive oil, canned tuna, canned tomatoes, rice, pasta, and beans. Cooking  Cook foods with extra-virgin olive oil instead of using butter or other vegetable oils.  Have meat as a side dish, and have vegetables or grains as your main dish. This means having meat in small portions or adding small amounts of meat to foods like pasta or stew.  Use beans or vegetables instead of meat in common dishes like chili or lasagna.  Experiment with different cooking methods. Try roasting or broiling vegetables instead of steaming or sauteing them.  Add frozen vegetables to soups, stews, pasta, or rice.  Add nuts or seeds for added healthy fat at each meal. You can add these to yogurt, salads, or vegetable dishes.  Marinate fish or vegetables using olive oil, lemon juice, garlic, and fresh herbs. Meal planning   Plan to eat 1 vegetarian meal one day each week. Try to work up to 2 vegetarian meals, if possible.  Eat seafood 2 or more times a week.  Have healthy snacks readily available, such as: ? Vegetable sticks with hummus. ? Greek yogurt. ? Fruit and nut trail mix.  Eat balanced meals throughout the week. This includes: ? Fruit: 2-3 servings a day ? Vegetables: 4-5 servings a day ? Low-fat dairy: 2 servings a day ? Fish, poultry, or lean meat: 1 serving a day ? Beans and legumes: 2 or more servings a week ?   Nuts and seeds: 1-2 servings a day ? Whole grains: 6-8 servings a day ? Extra-virgin olive oil: 3-4 servings a day  Limit red meat and sweets to only a few servings a month What are my food choices?  Mediterranean diet ? Recommended  Grains: Whole-grain pasta. Brown rice. Bulgar wheat. Polenta. Couscous. Whole-wheat bread. Oatmeal. Quinoa.  Vegetables: Artichokes. Beets. Broccoli. Cabbage. Carrots. Eggplant. Green beans. Chard. Kale. Spinach. Onions. Leeks. Peas. Squash.  Tomatoes. Peppers. Radishes.  Fruits: Apples. Apricots. Avocado. Berries. Bananas. Cherries. Dates. Figs. Grapes. Lemons. Melon. Oranges. Peaches. Plums. Pomegranate.  Meats and other protein foods: Beans. Almonds. Sunflower seeds. Pine nuts. Peanuts. Cod. Salmon. Scallops. Shrimp. Tuna. Tilapia. Clams. Oysters. Eggs.  Dairy: Low-fat milk. Cheese. Greek yogurt.  Beverages: Water. Red wine. Herbal tea.  Fats and oils: Extra virgin olive oil. Avocado oil. Grape seed oil.  Sweets and desserts: Greek yogurt with honey. Baked apples. Poached pears. Trail mix.  Seasoning and other foods: Basil. Cilantro. Coriander. Cumin. Mint. Parsley. Sage. Rosemary. Tarragon. Garlic. Oregano. Thyme. Pepper. Balsalmic vinegar. Tahini. Hummus. Tomato sauce. Olives. Mushrooms. ? Limit these  Grains: Prepackaged pasta or rice dishes. Prepackaged cereal with added sugar.  Vegetables: Deep fried potatoes (french fries).  Fruits: Fruit canned in syrup.  Meats and other protein foods: Beef. Pork. Lamb. Poultry with skin. Hot dogs. Bacon.  Dairy: Ice cream. Sour cream. Whole milk.  Beverages: Juice. Sugar-sweetened soft drinks. Beer. Liquor and spirits.  Fats and oils: Butter. Canola oil. Vegetable oil. Beef fat (tallow). Lard.  Sweets and desserts: Cookies. Cakes. Pies. Candy.  Seasoning and other foods: Mayonnaise. Premade sauces and marinades. The items listed may not be a complete list. Talk with your dietitian about what dietary choices are right for you. Summary  The Mediterranean diet includes both food and lifestyle choices.  Eat a variety of fresh fruits and vegetables, beans, nuts, seeds, and whole grains.  Limit the amount of red meat and sweets that you eat.  Talk with your health care provider about whether it is safe for you to drink red wine in moderation. This means 1 glass a day for nonpregnant women and 2 glasses a day for men. A glass of wine equals 5 oz (150 mL). This information  is not intended to replace advice given to you by your health care provider. Make sure you discuss any questions you have with your health care provider. Document Released: 01/31/2016 Document Revised: 02/07/2016 Document Reviewed: 01/31/2016 Elsevier Patient Education  2020 [employer]  

## 2019-04-08 NOTE — Progress Notes (Signed)
Name: Shannon Escobar   MRN: [id number]    DOB: May 08, 1976   Date:04/08/2019       Progress Note  Subjective  Chief Complaint  Chief Complaint  Patient presents with  . Establish Care  . Obesity    weight loss, discuss lipo    HPI  IDA: Likely secondary to menorrhagia; seeing Dr. Feliberto Gottron with OB/GYN and Dr. Smith Robert with hematology. Had IUD placed and periods have been significantly lighter.  Receiving Iron infusions (feraheme).  Review of her labs shows great improvement s/p infusions.  She denies fatigue, no PICA.  Prediabetes: Had gestational DM when pregnant; has since has mild prediabetes.  Had A1C in April 2020 that was 5.6%.  She is not taking metformin - took only 1 tablet and then did not take the rest.  She denies polyuria, polydipsia, or polyphagia.  She has cut out sodas, drinking plenty of water. Denies family history of thyroid cancer; no personal history of pancreatitis or thyroid cancer.   Obesity: She walks for about 2 miles each day, doing little workouts from [employer].  Was given phentermine by her GYN and this made her jittery.  Not eating regular meals, but is trying to eat some protein in the morning (eggs, yogurt), tuna for lunch; eats out for dinner sometimes and cooks at home sometimes.  Avoiding red meat and fried foods. Her Goal is BMI 33. Body mass index is 38.83 kg/m.  HS: Is still taking Humira but is weaning off; she had surgery last year to bilateral axilla to remove her sweat glands.  No abscesses or skin infections since October. No longer follow up with Dermatology with Dr. Janyth Contes through [facility].   DJD Lumbar Spine: No pain right now; noted in chart.   Patient Active Problem List   Diagnosis Date Noted  . Iron deficiency anemia 10/12/2018    Past Surgical History:  Procedure Laterality Date  . ABDOMINAL SURGERY    . BREAST CYST ASPIRATION Right 2011   abcess drained  . CESAREAN SECTION     x2  . CHOLECYSTECTOMY    . TUBAL LIGATION       Family History  Problem Relation Age of Onset  . Diabetes Mother     Social History   Socioeconomic History  . Marital status: Single    Spouse name: Not on file  . Number of children: 2  . Years of education: Not on file  . Highest education level: Not on file  Occupational History  . Not on file  Social Needs  . Financial resource strain: Not hard at all  . Food insecurity    Worry: Never true    Inability: Never true  . Transportation needs    Medical: No    Non-medical: No  Tobacco Use  . Smoking status: Never Smoker  . Smokeless tobacco: Never Used  Substance and Sexual Activity  . Alcohol use: No    Comment: occassional in social  . Drug use: Never  . Sexual activity: Yes    Partners: Male    Birth control/protection: I.U.D.  Lifestyle  . Physical activity    Days per week: 5 days    Minutes per session: 30 min  . Stress: Only a little  Relationships  . Social connections    Talks on phone: More than three times a week    Gets together: Never    Attends religious service: More than 4 times per year    Active member of club  or organization: No    Attends meetings of clubs or organizations: Never    Relationship status: Never married  . Intimate partner violence    Fear of current or ex partner: No    Emotionally abused: No    Physically abused: No    Forced sexual activity: No  Other Topics Concern  . Not on file  Social History Narrative  . Not on file     Current Outpatient Medications:  .  Adalimumab (HUMIRA PEN) 40 MG/0.4ML PNKT, Inject 1 Dose into the skin once a week., Disp: , Rfl:  .  metFORMIN (GLUCOPHAGE) 500 MG tablet, Take by mouth daily with breakfast., Disp: , Rfl:  .  ibuprofen (ADVIL,MOTRIN) 800 MG tablet, Take 1 tablet (800 mg total) by mouth every 8 (eight) hours as needed for moderate pain. (Patient not taking: Reported on 10/12/2018), Disp: 15 tablet, Rfl: 0  Allergies  Allergen Reactions  . Penicillins     I personally  reviewed active problem list, medication list, allergies, family history, social history, health maintenance, notes from last encounter, lab results with the patient/caregiver today.   ROS  Ten systems reviewed and is negative except as mentioned in HPI  Objective  Vitals:   04/08/19 1249  BP: 112/78  Pulse: 72  Resp: 16  Temp: 97.8 F (36.6 C)  TempSrc: Temporal  Weight: 247 lb 14.4 oz (112.4 kg)  Height: 5\' 7"  (1.702 m)    Body mass index is 38.83 kg/m.  Physical Exam Constitutional: Patient appears well-developed and well-nourished. No distress.  HENT: Head: Normocephalic and atraumatic.  Eyes: Conjunctivae and EOM are normal. No scleral icterus.  Neck: Normal range of motion. Neck supple. No JVD present.  Cardiovascular: Normal rate, regular rhythm and normal heart sounds.  No murmur heard. No BLE edema. Pulmonary/Chest: Effort normal and breath sounds normal. No respiratory distress. Musculoskeletal: Normal range of motion, no joint effusions. No gross deformities Neurological: Pt is alert and oriented to person, place, and time. No cranial nerve deficit. Coordination, balance, strength, speech and gait are normal.  Skin: Skin is warm and dry. No rash noted. No erythema.  Psychiatric: Patient has a normal mood and affect. behavior is normal. Judgment and thought content normal.  No results found for this or any previous visit (from the past 72 hour(s)).  PHQ2/9: Depression screen PHQ 2/9 04/08/2019  Decreased Interest 0  Down, Depressed, Hopeless 0  PHQ - 2 Score 0  Altered sleeping 0  Tired, decreased energy 0  Change in appetite 0  Feeling bad or failure about yourself  0  Trouble concentrating 0  Moving slowly or fidgety/restless 0  Suicidal thoughts 0  PHQ-9 Score 0  Difficult doing work/chores Not difficult at all   PHQ-2/9 Result is negative.    Fall Risk: Fall Risk  04/08/2019  Falls in the past year? 0  Number falls in past yr: 0  Injury with  Fall? 0  Follow up Falls evaluation completed   Assessment & Plan  1. Iron deficiency anemia, unspecified iron deficiency anemia type Continue follow up with Hematology and GYN; appears to be improving with IUD.  2. Class 2 severe obesity due to excess calories with serious comorbidity and body mass index (BMI) of 38.0 to 38.9 in adult [facility]) - Discussed importance of 150 minutes of physical activity weekly, eat two servings of fish weekly, eat one serving of tree nuts ( cashews, pistachios, pecans, almonds.Marland Kitchen) every other day, eat 6 servings of fruit/vegetables daily and  drink plenty of water and avoid sweet beverages.  - Liraglutide -Weight Management (SAXENDA) 18 MG/3ML SOPN; Inject 0.6 mg into the skin daily for 7 days, THEN 1.2 mg daily.  Dispense: 9 mL; Refill: 3 - Insulin Pen Needle (NOVOFINE) 32G X 6 MM MISC; 1 each by Does not apply route daily.  Dispense: 100 each; Refill: 3 - Hemoglobin A1c - COMPLETE METABOLIC PANEL WITH GFR - Lipid panel - TSH  3. Prediabetes - Liraglutide -Weight Management (SAXENDA) 18 MG/3ML SOPN; Inject 0.6 mg into the skin daily for 7 days, THEN 1.2 mg daily.  Dispense: 9 mL; Refill: 3 - Insulin Pen Needle (NOVOFINE) 32G X 6 MM MISC; 1 each by Does not apply route daily.  Dispense: 100 each; Refill: 3 - Hemoglobin A1c - COMPLETE METABOLIC PANEL WITH GFR  4. Hidradenitis suppurativa - Stable; no issues recently  5. Menorrhagia with regular cycle - IUD in place; followed by GYN  6. DJD (degenerative joint disease), lumbosacral - Stable  7. Lipid screening - Lipid panel  8. Need for hepatitis C screening test - Hepatitis C antibody  9. Screening for HIV without presence of risk factors - HIV Antibody (routine testing w rflx)  10. Encounter to establish care

## 2019-04-11 ENCOUNTER — Encounter: Payer: Self-pay | Admitting: Family Medicine

## 2019-04-11 LAB — HEMOGLOBIN A1C
Hgb A1c MFr Bld: 5.9 % of total Hgb — ABNORMAL HIGH (ref <5.7)
Mean Plasma Glucose: 123 (calc)
eAG (mmol/L): 6.8 (calc)

## 2019-04-11 LAB — COMPLETE METABOLIC PANEL WITH GFR
AG Ratio: 1.2 (calc) (ref 1.0–2.5)
ALT: 31 U/L — ABNORMAL HIGH (ref 6–29)
AST: 22 U/L (ref 10–30)
Albumin: 4.2 g/dL (ref 3.6–5.1)
Alkaline phosphatase (APISO): 106 U/L (ref 31–125)
BUN: 12 mg/dL (ref 7–25)
CO2: 28 mmol/L (ref 20–32)
Calcium: 9.7 mg/dL (ref 8.6–10.2)
Chloride: 104 mmol/L (ref 98–110)
Creat: 0.71 mg/dL (ref 0.50–1.10)
GFR, Est African American: 121 mL/min/{1.73_m2} (ref >=60)
GFR, Est Non African American: 104 mL/min/{1.73_m2} (ref >=60)
Globulin: 3.6 g/dL (calc) (ref 1.9–3.7)
Glucose, Bld: 78 mg/dL (ref 65–99)
Potassium: 4.1 mmol/L (ref 3.5–5.3)
Sodium: 139 mmol/L (ref 135–146)
Total Bilirubin: 0.4 mg/dL (ref 0.2–1.2)
Total Protein: 7.8 g/dL (ref 6.1–8.1)

## 2019-04-11 LAB — HEPATITIS C ANTIBODY
Hepatitis C Ab: NONREACTIVE
SIGNAL TO CUT-OFF: 0.13 (ref <1.00)

## 2019-04-11 LAB — HIV ANTIBODY (ROUTINE TESTING W REFLEX): HIV 1&2 Ab, 4th Generation: NONREACTIVE

## 2019-04-11 LAB — TSH: TSH: 1.11 mIU/L

## 2019-04-11 LAB — LIPID PANEL
Cholesterol: 159 mg/dL (ref <200)
HDL: 44 mg/dL — ABNORMAL LOW (ref >=50)
LDL Cholesterol (Calc): 98 mg/dL (calc)
Non-HDL Cholesterol (Calc): 115 mg/dL (calc) (ref <130)
Total CHOL/HDL Ratio: 3.6 (calc) (ref <5.0)
Triglycerides: 84 mg/dL (ref <150)

## 2019-04-12 ENCOUNTER — Inpatient Hospital Stay: Payer: Commercial Managed Care - PPO

## 2019-04-12 ENCOUNTER — Inpatient Hospital Stay: Payer: Commercial Managed Care - PPO | Admitting: Oncology

## 2019-04-12 ENCOUNTER — Other Ambulatory Visit: Payer: Self-pay | Admitting: Family Medicine

## 2019-04-12 DIAGNOSIS — R7303 Prediabetes: Secondary | ICD-10-CM

## 2019-04-12 MED ORDER — LIRAGLUTIDE 18 MG/3ML ~~LOC~~ SOPN: PEN_INJECTOR | SUBCUTANEOUS | 3 refills | Status: DC

## 2019-04-18 IMAGING — US US BREAST*L* LIMITED INC AXILLA
1 series · 6 of 6 positions shown · non-contrast
Comparison: Previous exam(s).

CLINICAL DATA: 41-year-old female presenting for re-evaluation of
the left breast abscess.

EXAM:
ULTRASOUND OF THE LEFT BREAST

[Series 1: us breast*left* limited inc axilla · 0.05mm/px · 6 of 6 slices shown]
[im 1/6]
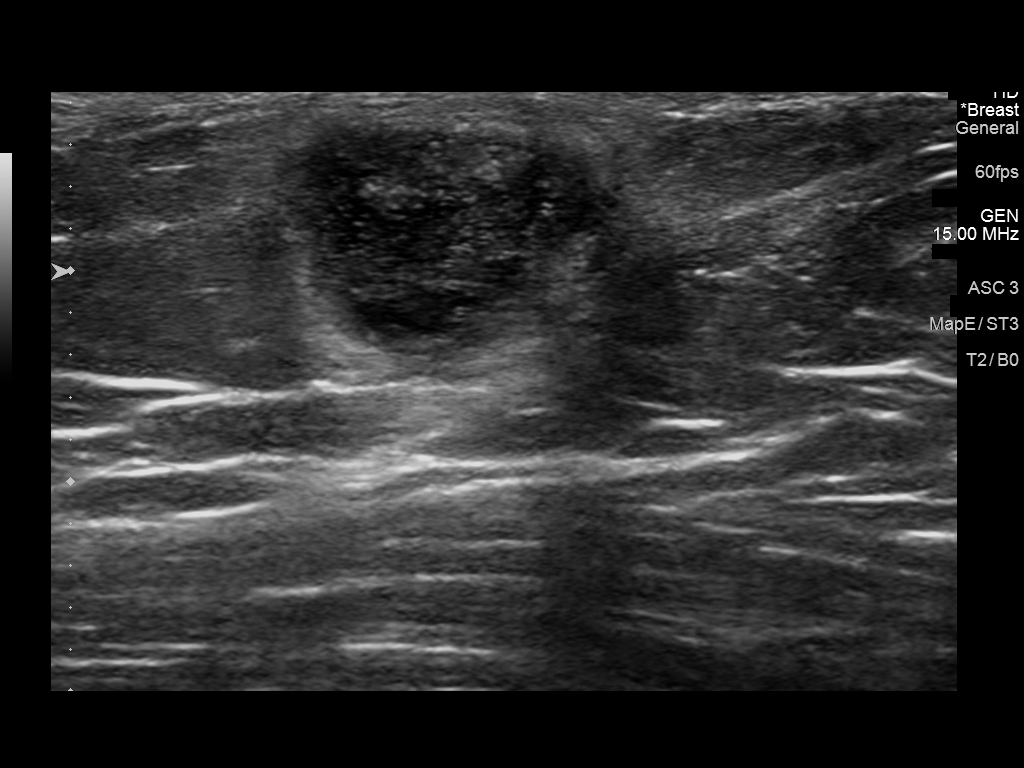
[im 2/6]
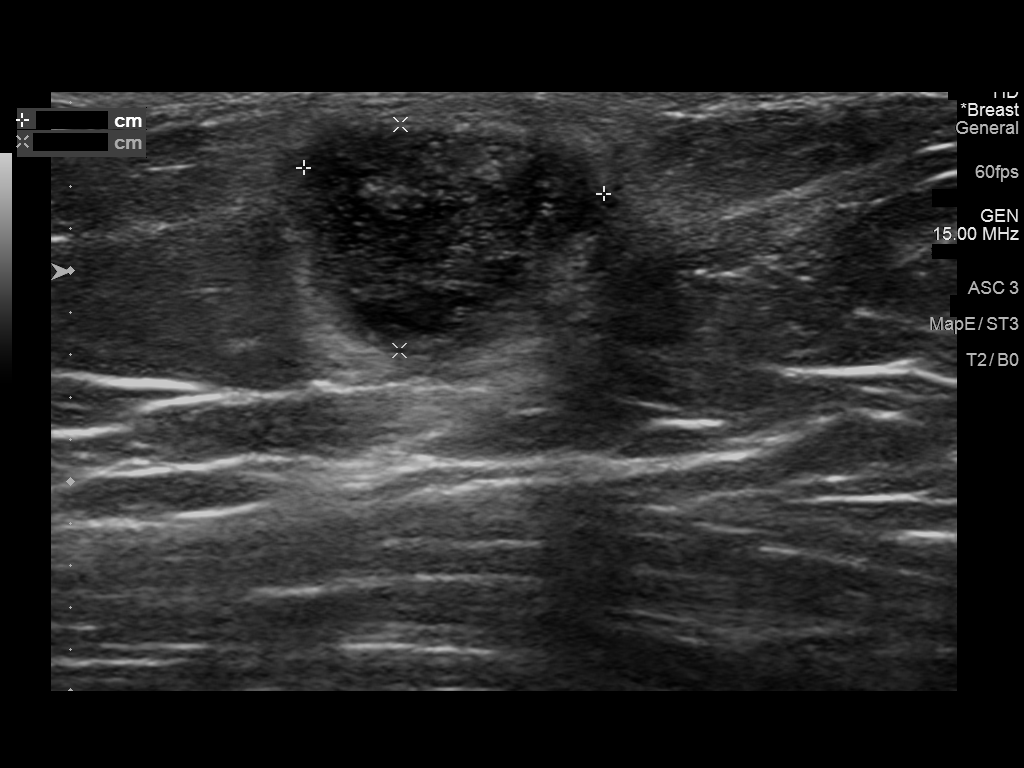
[im 3/6]
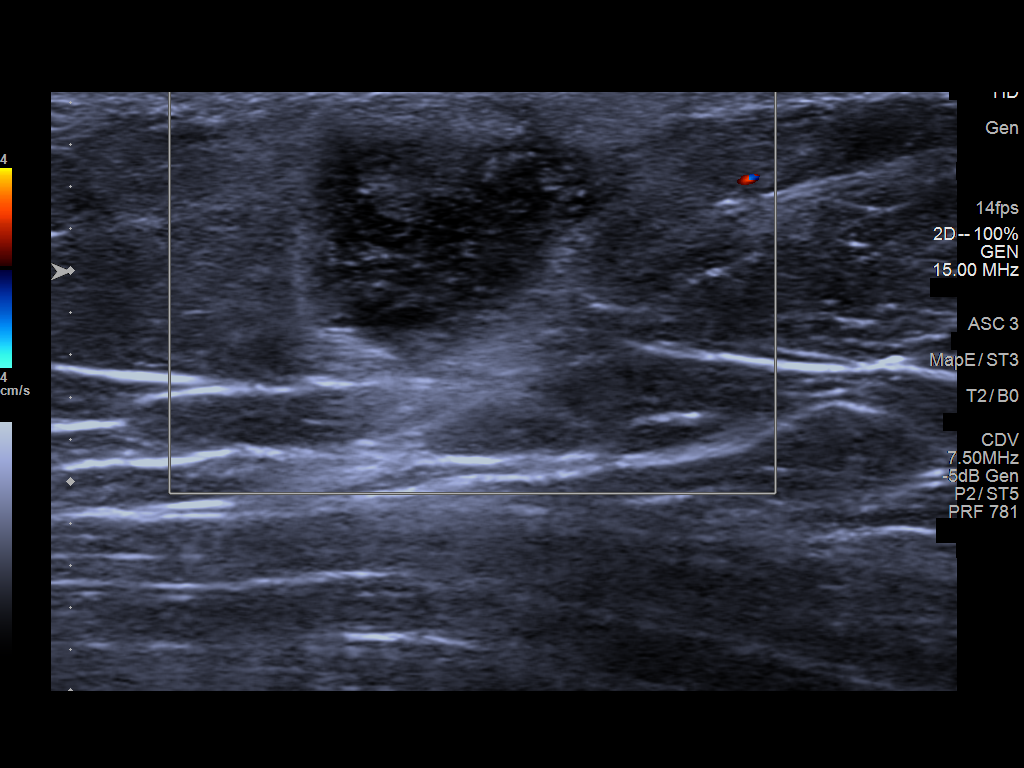
[im 4/6]
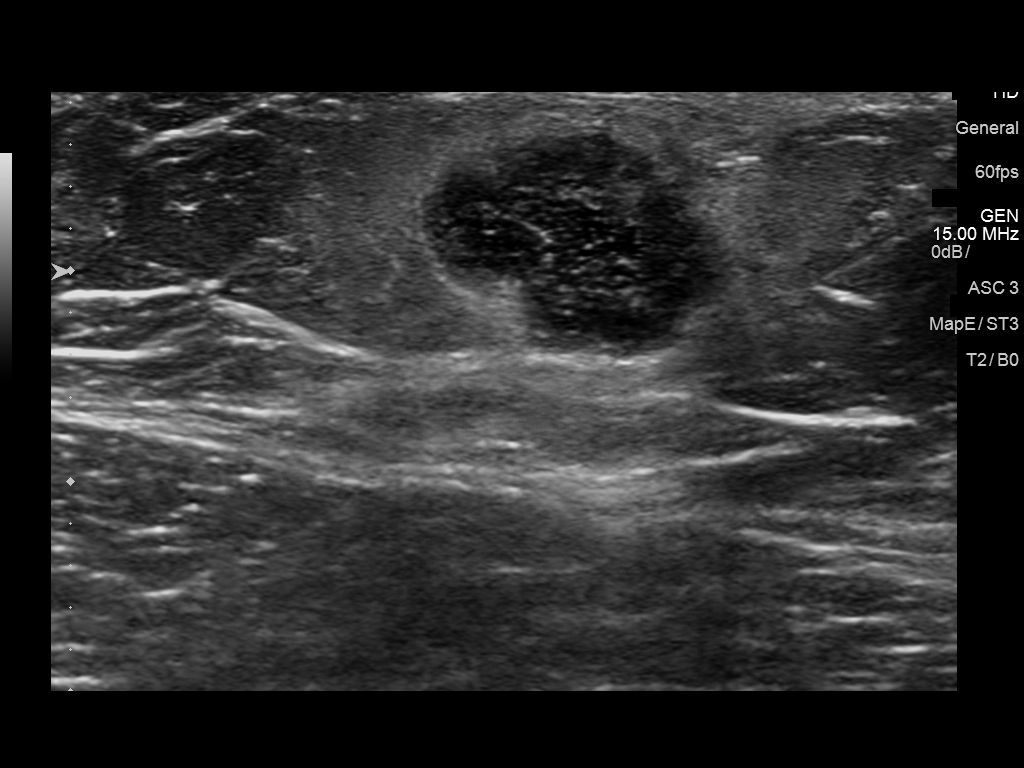
[im 5/6]
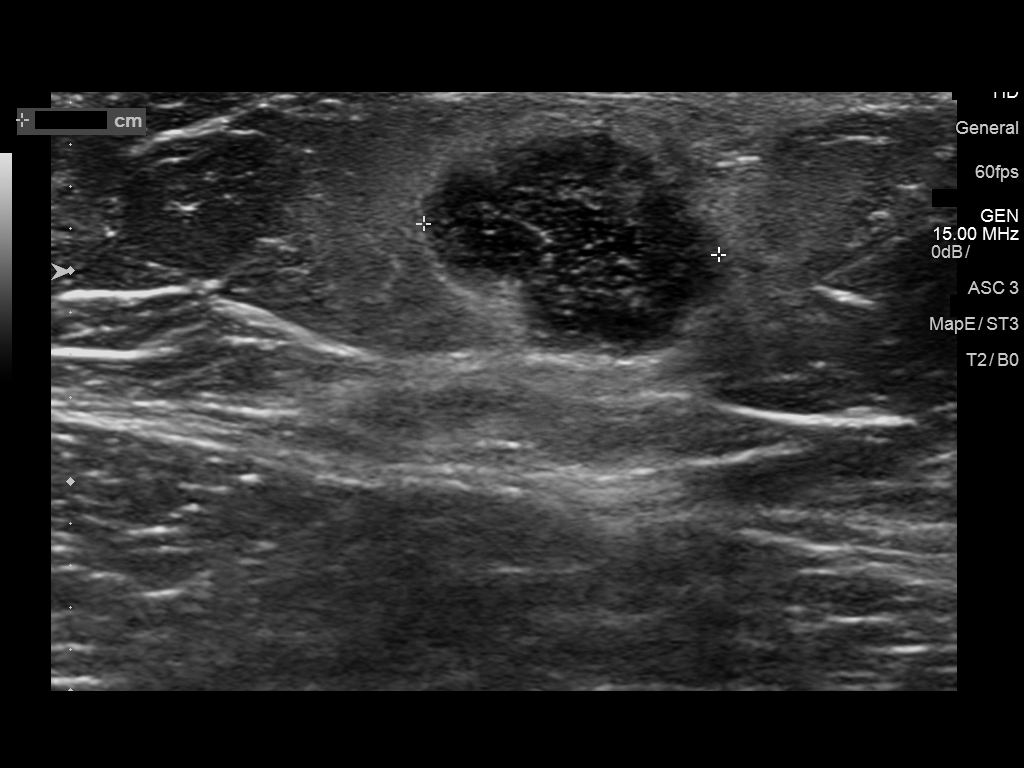
[im 6/6]
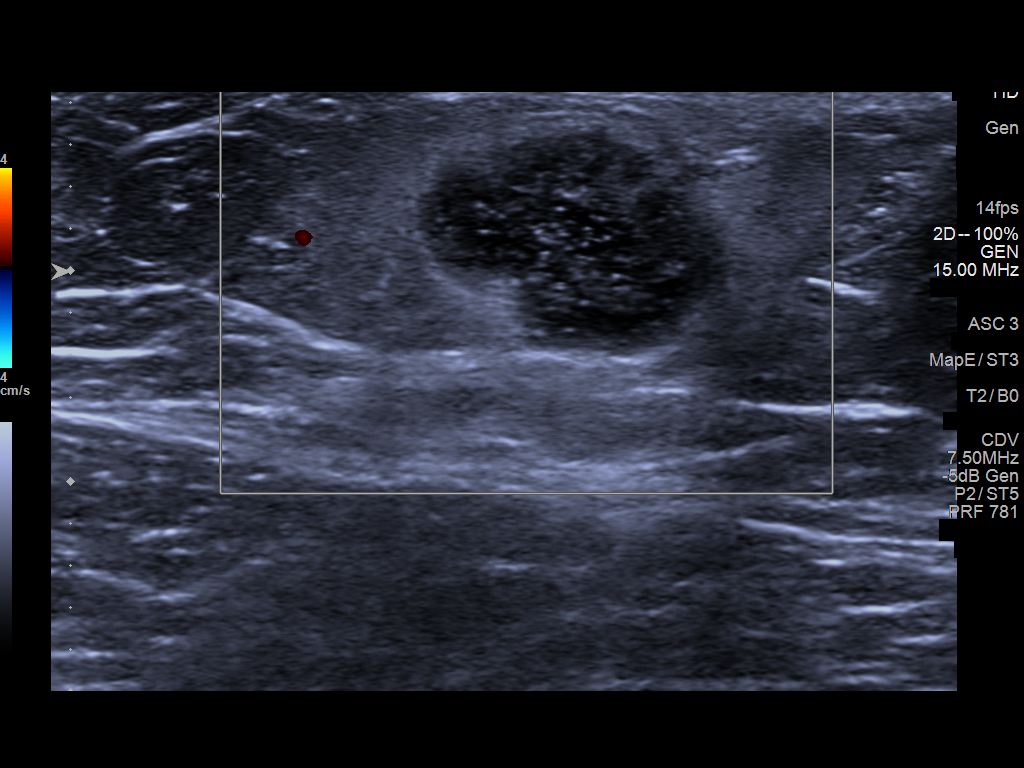

[6 of 6 positions shown; findings below may reference images not displayed]

FINDINGS: On physical exam, I palpate a firm 1-2 cm mass in the superior left
breast.

Targeted ultrasound is performed, showing a discrete, circumscribed
heterogenous collection at the 12 o'clock position 22 cm from the
nipple. It measures 1.4 x 1.4 x 1.1 cm. There is no significant
internal vascularity. This area appears slightly more discrete,
though it is similar in size from prior study.
IMPRESSION: Left breast abscess without significant interval improvement. The
patient is currently on antibiotics, with 2 days left for the
current course.

RECOMMENDATION:
1. Left breast abscess aspiration. This was performed to follow.
Please see additional dictation.
2. Completion of antibiotic therapy course.
3. Ultrasound follow-up of the left breast in 5-7 days.

I have discussed the findings and recommendations with the patient.
Results were also provided in writing at the conclusion of the
visit. If applicable, a reminder letter will be sent to the patient
regarding the next appointment.

BI-RADS CATEGORY  3: Probably benign.

## 2019-04-28 ENCOUNTER — Encounter: Payer: Self-pay | Admitting: Oncology

## 2019-04-28 ENCOUNTER — Other Ambulatory Visit: Payer: Self-pay

## 2019-04-28 NOTE — Progress Notes (Signed)
Patient pre screened for office appointment, no questions or concerns today. 

## 2019-04-29 ENCOUNTER — Inpatient Hospital Stay (HOSPITAL_BASED_OUTPATIENT_CLINIC_OR_DEPARTMENT_OTHER): Payer: Commercial Managed Care - PPO | Admitting: Oncology

## 2019-04-29 ENCOUNTER — Other Ambulatory Visit: Payer: Self-pay

## 2019-04-29 ENCOUNTER — Inpatient Hospital Stay: Payer: Commercial Managed Care - PPO | Attending: Oncology

## 2019-04-29 VITALS — BP 154/99 | HR 70 | Resp 16 | Wt 249.8 lb

## 2019-04-29 DIAGNOSIS — E119 Type 2 diabetes mellitus without complications: Secondary | ICD-10-CM | POA: Insufficient documentation

## 2019-04-29 DIAGNOSIS — Z791 Long term (current) use of non-steroidal anti-inflammatories (NSAID): Secondary | ICD-10-CM | POA: Insufficient documentation

## 2019-04-29 DIAGNOSIS — D509 Iron deficiency anemia, unspecified: Secondary | ICD-10-CM

## 2019-04-29 DIAGNOSIS — Z794 Long term (current) use of insulin: Secondary | ICD-10-CM | POA: Diagnosis not present

## 2019-04-29 LAB — CBC WITH DIFFERENTIAL/PLATELET
Abs Immature Granulocytes: 0.01 10*3/uL (ref 0.00–0.07)
Basophils Absolute: 0 10*3/uL (ref 0.0–0.1)
Basophils Relative: 0 %
Eosinophils Absolute: 0.2 10*3/uL (ref 0.0–0.5)
Eosinophils Relative: 2 %
HCT: 42.1 % (ref 36.0–46.0)
Hemoglobin: 13.8 g/dL (ref 12.0–15.0)
Immature Granulocytes: 0 %
Lymphocytes Relative: 17 %
Lymphs Abs: 1.2 10*3/uL (ref 0.7–4.0)
MCH: 29.1 pg (ref 26.0–34.0)
MCHC: 32.8 g/dL (ref 30.0–36.0)
MCV: 88.6 fL (ref 80.0–100.0)
Monocytes Absolute: 0.4 10*3/uL (ref 0.1–1.0)
Monocytes Relative: 5 %
Neutro Abs: 5.5 10*3/uL (ref 1.7–7.7)
Neutrophils Relative %: 76 %
Platelets: 297 10*3/uL (ref 150–400)
RBC: 4.75 MIL/uL (ref 3.87–5.11)
RDW: 13.6 % (ref 11.5–15.5)
WBC: 7.3 10*3/uL (ref 4.0–10.5)
nRBC: 0 % (ref 0.0–0.2)

## 2019-04-29 LAB — IRON AND TIBC
Iron: 31 ug/dL (ref 28–170)
Saturation Ratios: 9 % — ABNORMAL LOW (ref 10.4–31.8)
TIBC: 344 ug/dL (ref 250–450)
UIBC: 313 ug/dL

## 2019-04-29 LAB — FERRITIN: Ferritin: 27 ng/mL (ref 11–307)

## 2019-04-29 NOTE — Progress Notes (Signed)
Rechecked b/p manually 140/90. Pt has only ate fig newtons today . She is trying to exercise by walking. Encouraged her to eat more esp. Protein. She is concerned her wt fluctuate 242 to249 today this is one week

## 2019-05-02 ENCOUNTER — Other Ambulatory Visit: Payer: Self-pay | Admitting: Oncology

## 2019-05-02 ENCOUNTER — Telehealth: Payer: Self-pay | Admitting: *Deleted

## 2019-05-02 NOTE — Telephone Encounter (Signed)
-----   Message from Sindy Guadeloupe, MD sent at 05/02/2019  8:25 AM EST ----- She is not anemic but her iron studies did come back low.  Please ask her if she is willing to try more IV iron at this time

## 2019-05-02 NOTE — Telephone Encounter (Signed)
Called pt to let her know that her hgb is normal so she is not anemic but her ferritin levels is low and Dr. Janese Banks wanted to ask pt if she wants to come in and get iron. Patient says yes. But she is in training and won't be able to start it until 12/1 and then she is back to her normal hours of being off every tues and Friday. I told her I would check with Janese Banks and with her insurance because I was not sure if the pt will get another feraheme  Doses 2 of them or venofer and it would be 5 of them. I told her that I have her dates written down and will call her once I check with rao and her insurance.  Her dates are: 12/1 , 12//4, 12/8, 12/11, 12/14, 12/18.

## 2019-05-02 NOTE — Progress Notes (Signed)
Hematology/Oncology Consult note [facility]  Telephone:([phone]) [phone] [phone]  Patient Care Team: Doren CustardBoyce, Emily E, FNP as PCP - General (Family Medicine)   Name of the patient: Shannon Octaveiffany Escobar  [id number]  04/17/1976   Date of visit: 05/02/19  Diagnosis-iron deficiency anemia  Chief complaint/ Reason for visit-routine follow-up of iron deficiency anemia  Heme/Onc history: Patient is a 43 year old African-American female who has been referred to us for anemia.Most recent CBC from 09/20/2018 showed white count of 7.3, H&H of 6.5/25.7 with an MCV of 58.8 and a platelet count of 343. CMP was within normal limits. TSH was normal. Iron study showed a ferritin of 1. Of note patient also had a CBC back in August 2019 which showed a white count of 6.7, H&H of 6.8/25.1 with an MCV of 57 and a platelet count of 396.Patient has a history of fibroids and recent ultrasound showed she had 6 fibroids ranging from 2 to 5 cm.  Patient's iron deficiency anemia was attributed to her menorrhagia.     Interval history- She was seen by GYN and had IUD placed and states that her menstrual cycles have lightened since then.  She denies any significant complaints at this time   ECOG PS- 0 Pain scale- 0  Review of systems- Review of Systems  Constitutional: Negative for chills, fever, malaise/fatigue and weight loss.  HENT: Negative for congestion, ear discharge and nosebleeds.   Eyes: Negative for blurred vision.  Respiratory: Negative for cough, hemoptysis, sputum production, shortness of breath and wheezing.   Cardiovascular: Negative for chest pain, palpitations, orthopnea and claudication.  Gastrointestinal: Negative for abdominal pain, blood in stool, constipation, diarrhea, heartburn, melena, nausea and vomiting.  Genitourinary: Negative for dysuria, flank pain, frequency, hematuria and urgency.  Musculoskeletal: Negative for back pain, joint pain and myalgias.   Skin: Negative for rash.  Neurological: Negative for dizziness, tingling, focal weakness, seizures, weakness and headaches.  Endo/Heme/Allergies: Does not bruise/bleed easily.  Psychiatric/Behavioral: Negative for depression and suicidal ideas. The patient does not have insomnia.       Allergies  Allergen Reactions  . Penicillins      Past Medical History:  Diagnosis Date  . Anemia   . Diabetes mellitus without complication (HCC)    had gestational diabetes     Past Surgical History:  Procedure Laterality Date  . ABDOMINAL SURGERY    . BREAST CYST ASPIRATION Right 2011   abcess drained  . CESAREAN SECTION     x2  . CHOLECYSTECTOMY    . TUBAL LIGATION      Social History   Socioeconomic History  . Marital status: Single    Spouse name: Not on file  . Number of children: 2  . Years of education: Not on file  . Highest education level: Not on file  Occupational History  . Not on file  Social Needs  . Financial resource strain: Not hard at all  . Food insecurity    Worry: Never true    Inability: Never true  . Transportation needs    Medical: No    Non-medical: No  Tobacco Use  . Smoking status: Never Smoker  . Smokeless tobacco: Never Used  Substance and Sexual Activity  . Alcohol use: No    Comment: occassional in social  . Drug use: Never  . Sexual activity: Yes    Partners: Male    Birth control/protection: I.U.D.  Lifestyle  . Physical activity    Days per week:  5 days    Minutes per session: 30 min  . Stress: Only a little  Relationships  . Social connections    Talks on phone: More than three times a week    Gets together: Never    Attends religious service: More than 4 times per year    Active member of club or organization: No    Attends meetings of clubs or organizations: Never    Relationship status: Never married  . Intimate partner violence    Fear of current or ex partner: No    Emotionally abused: No    Physically abused: No     Forced sexual activity: No  Other Topics Concern  . Not on file  Social History Narrative  . Not on file    Family History  Problem Relation Age of Onset  . Diabetes Mother      Current Outpatient Medications:  .  Adalimumab (HUMIRA PEN) 40 MG/0.4ML PNKT, Inject 1 Dose into the skin once a week., Disp: , Rfl:  .  ibuprofen (ADVIL,MOTRIN) 800 MG tablet, Take 1 tablet (800 mg total) by mouth every 8 (eight) hours as needed for moderate pain., Disp: 15 tablet, Rfl: 0 .  Insulin Pen Needle (NOVOFINE) 32G X 6 MM MISC, 1 each by Does not apply route daily., Disp: 100 each, Rfl: 3 .  liraglutide (VICTOZA) 18 MG/3ML SOPN, Inject 0.6mg  subq once daily x7 days, then increase to 1.2mg  subq once daily., Disp: 3 mL, Rfl: 3  Physical exam:  Vitals:   04/29/19 1423  BP: (!) 154/99  Pulse: 70  Resp: 16  SpO2: 97%  Weight: 249 lb 12.8 oz (113.3 kg)   Physical Exam Constitutional:      General: She is not in acute distress. HENT:     Head: Normocephalic and atraumatic.  Eyes:     Pupils: Pupils are equal, round, and reactive to light.  Neck:     Musculoskeletal: Normal range of motion.  Cardiovascular:     Rate and Rhythm: Normal rate and regular rhythm.     Heart sounds: Normal heart sounds.  Pulmonary:     Effort: Pulmonary effort is normal.     Breath sounds: Normal breath sounds.  Abdominal:     General: Bowel sounds are normal.     Palpations: Abdomen is soft.  Skin:    General: Skin is warm and dry.  Neurological:     Mental Status: She is alert and oriented to person, place, and time.      CMP Latest Ref Rng & Units 04/08/2019  Glucose 65 - 99 mg/dL 78  BUN 7 - 25 mg/dL 12  Creatinine 0.50 - 1.10 mg/dL 0.71  Sodium 135 - 146 mmol/L 139  Potassium 3.5 - 5.3 mmol/L 4.1  Chloride 98 - 110 mmol/L 104  CO2 20 - 32 mmol/L 28  Calcium 8.6 - 10.2 mg/dL 9.7  Total Protein 6.1 - 8.1 g/dL 7.8  Total Bilirubin 0.2 - 1.2 mg/dL 0.4  Alkaline Phos 38 - 126 U/L -  AST 10 - 30  U/L 22  ALT 6 - 29 U/L 31(H)   CBC Latest Ref Rng & Units 04/29/2019  WBC 4.0 - 10.5 K/uL 7.3  Hemoglobin 12.0 - 15.0 g/dL 13.8  Hematocrit 36.0 - 46.0 % 42.1  Platelets 150 - 400 K/uL 297     Assessment and plan- Patient is a 43 y.o. female with iron deficiency anemia likely secondary to menorrhagia here for routine follow-up  Patient  is not anemic today.  Her hemoglobin is 13.8.  However her ferritin is still low at 27 .  Iron saturation is low at 9%.  It would be reasonable to give her more IV iron at this time.  Hopefully with the placement of IUD with need for iron infusions will go down in the future.  Her iron studies came back after patient had left from the clinic.  We will call her and schedule her for IV iron infusions.  Repeat CBC ferritin and iron studies in 3 in 6 months and I will see her back in 6 months   Visit Diagnosis 1. Iron deficiency anemia, unspecified iron deficiency anemia type      Dr. Owens Shark, MD, MPH [facility] [phone] 05/02/2019 12:41 PM

## 2019-05-23 ENCOUNTER — Other Ambulatory Visit: Payer: Self-pay

## 2019-05-24 ENCOUNTER — Inpatient Hospital Stay: Payer: Commercial Managed Care - PPO

## 2019-05-27 ENCOUNTER — Inpatient Hospital Stay: Payer: Commercial Managed Care - PPO

## 2019-05-31 ENCOUNTER — Other Ambulatory Visit: Payer: Self-pay

## 2019-05-31 ENCOUNTER — Inpatient Hospital Stay: Payer: Commercial Managed Care - PPO | Attending: Oncology

## 2019-05-31 VITALS — BP 119/68 | HR 69 | Temp 98.6°F | Resp 16

## 2019-05-31 DIAGNOSIS — D509 Iron deficiency anemia, unspecified: Secondary | ICD-10-CM | POA: Insufficient documentation

## 2019-05-31 MED ORDER — SODIUM CHLORIDE 0.9 % IV SOLN
Freq: Once | INTRAVENOUS | Status: AC
  Administered 2019-05-31: 14:00:00 via INTRAVENOUS
  Filled 2019-05-31: qty 250

## 2019-05-31 MED ORDER — IRON SUCROSE 20 MG/ML IV SOLN
200.0000 mg | Freq: Once | INTRAVENOUS | Status: AC
  Administered 2019-05-31: 14:00:00 200 mg via INTRAVENOUS
  Filled 2019-05-31: qty 10

## 2019-06-03 ENCOUNTER — Inpatient Hospital Stay: Payer: Commercial Managed Care - PPO

## 2019-06-06 ENCOUNTER — Other Ambulatory Visit: Payer: Self-pay

## 2019-06-07 ENCOUNTER — Other Ambulatory Visit: Payer: Self-pay

## 2019-06-07 ENCOUNTER — Inpatient Hospital Stay: Payer: Commercial Managed Care - PPO

## 2019-06-07 VITALS — BP 138/81 | HR 71 | Temp 98.3°F | Resp 18

## 2019-06-07 DIAGNOSIS — D509 Iron deficiency anemia, unspecified: Secondary | ICD-10-CM | POA: Diagnosis not present

## 2019-06-07 MED ORDER — SODIUM CHLORIDE 0.9 % IV SOLN
Freq: Once | INTRAVENOUS | Status: AC
  Filled 2019-06-07: qty 250

## 2019-06-07 MED ORDER — IRON SUCROSE 20 MG/ML IV SOLN
200.0000 mg | Freq: Once | INTRAVENOUS | Status: AC
  Administered 2019-06-07: 200 mg via INTRAVENOUS
  Filled 2019-06-07: qty 10

## 2019-06-07 NOTE — Progress Notes (Signed)
Pt tolerated infusion well. Pt denies any concerns or complaints at this time. No s/s of distress noted. Pt educated to call clinic with any concerns or questions, or to call 911/report to ER in the event of an emergency. Pt and VS stable at discharge.

## 2019-06-10 ENCOUNTER — Inpatient Hospital Stay: Payer: Commercial Managed Care - PPO

## 2019-06-10 ENCOUNTER — Other Ambulatory Visit: Payer: Self-pay

## 2019-06-10 VITALS — BP 134/86 | HR 76 | Temp 98.0°F | Resp 18

## 2019-06-10 DIAGNOSIS — D509 Iron deficiency anemia, unspecified: Secondary | ICD-10-CM | POA: Diagnosis not present

## 2019-06-10 MED ORDER — SODIUM CHLORIDE 0.9 % IV SOLN
Freq: Once | INTRAVENOUS | Status: AC
  Filled 2019-06-10: qty 250

## 2019-06-10 MED ORDER — IRON SUCROSE 20 MG/ML IV SOLN
200.0000 mg | Freq: Once | INTRAVENOUS | Status: AC
  Administered 2019-06-10: 200 mg via INTRAVENOUS
  Filled 2019-06-10: qty 10

## 2019-06-14 ENCOUNTER — Inpatient Hospital Stay: Payer: Commercial Managed Care - PPO

## 2019-06-21 ENCOUNTER — Inpatient Hospital Stay: Payer: Commercial Managed Care - PPO

## 2019-06-28 ENCOUNTER — Other Ambulatory Visit: Payer: Self-pay

## 2019-06-28 ENCOUNTER — Inpatient Hospital Stay: Payer: Commercial Managed Care - PPO | Attending: Oncology

## 2019-06-28 ENCOUNTER — Inpatient Hospital Stay: Payer: Commercial Managed Care - PPO

## 2019-06-28 VITALS — BP 153/78 | HR 76 | Temp 98.1°F | Resp 18

## 2019-06-28 DIAGNOSIS — D509 Iron deficiency anemia, unspecified: Secondary | ICD-10-CM | POA: Insufficient documentation

## 2019-06-28 DIAGNOSIS — Z79899 Other long term (current) drug therapy: Secondary | ICD-10-CM | POA: Diagnosis not present

## 2019-06-28 MED ORDER — SODIUM CHLORIDE 0.9 % IV SOLN
Freq: Once | INTRAVENOUS | Status: AC
  Filled 2019-06-28: qty 250

## 2019-06-28 MED ORDER — IRON SUCROSE 20 MG/ML IV SOLN
200.0000 mg | Freq: Once | INTRAVENOUS | Status: AC
  Administered 2019-06-28: 200 mg via INTRAVENOUS
  Filled 2019-06-28: qty 10

## 2019-06-30 ENCOUNTER — Other Ambulatory Visit: Payer: Self-pay

## 2019-07-01 ENCOUNTER — Other Ambulatory Visit: Payer: Self-pay

## 2019-07-01 ENCOUNTER — Inpatient Hospital Stay: Payer: Commercial Managed Care - PPO

## 2019-07-01 VITALS — BP 140/84 | HR 73 | Temp 97.2°F | Resp 18

## 2019-07-01 DIAGNOSIS — D509 Iron deficiency anemia, unspecified: Secondary | ICD-10-CM | POA: Diagnosis not present

## 2019-07-01 MED ORDER — SODIUM CHLORIDE 0.9 % IV SOLN
Freq: Once | INTRAVENOUS | Status: AC
  Filled 2019-07-01: qty 250

## 2019-07-01 MED ORDER — IRON SUCROSE 20 MG/ML IV SOLN
200.0000 mg | Freq: Once | INTRAVENOUS | Status: AC
  Administered 2019-07-01: 200 mg via INTRAVENOUS
  Filled 2019-07-01: qty 10

## 2019-07-12 ENCOUNTER — Encounter: Payer: Self-pay | Admitting: Family Medicine

## 2019-07-12 ENCOUNTER — Other Ambulatory Visit: Payer: Self-pay

## 2019-07-12 ENCOUNTER — Ambulatory Visit (INDEPENDENT_AMBULATORY_CARE_PROVIDER_SITE_OTHER): Payer: Commercial Managed Care - PPO | Admitting: Family Medicine

## 2019-07-12 VITALS — Ht 67.5 in | Wt 247.0 lb

## 2019-07-12 DIAGNOSIS — D509 Iron deficiency anemia, unspecified: Secondary | ICD-10-CM | POA: Diagnosis not present

## 2019-07-12 DIAGNOSIS — R7303 Prediabetes: Secondary | ICD-10-CM | POA: Diagnosis not present

## 2019-07-12 DIAGNOSIS — L732 Hidradenitis suppurativa: Secondary | ICD-10-CM | POA: Diagnosis not present

## 2019-07-12 DIAGNOSIS — Z6838 Body mass index (BMI) 38.0-38.9, adult: Secondary | ICD-10-CM

## 2019-07-12 MED ORDER — OZEMPIC (0.25 OR 0.5 MG/DOSE) 2 MG/1.5ML ~~LOC~~ SOPN: PEN_INJECTOR | SUBCUTANEOUS | 3 refills | Status: AC

## 2019-07-12 NOTE — Patient Instructions (Signed)
-   Low Carb Diet - <100 Carbohydrates per day. - For intermittent fasting - fast for no longer than 16hours, allow an eating window of 8 hours

## 2019-07-12 NOTE — Progress Notes (Signed)
=   Name: Shannon Escobar   MRN: [id number]    DOB: 12/20/1975   Date:07/12/2019       Progress Note  Subjective  Chief Complaint  Chief Complaint  Patient presents with  . Follow-up  . Medication Management  . Pre Diabetic    I connected with  Gonzella Lex on 07/12/19 at  9:40 AM EST by telephone and verified that I am speaking with the correct person using two identifiers.  I discussed the limitations, risks, security and privacy concerns of performing an evaluation and management service by telephone and the availability of in person appointments. Staff also discussed with the patient that there may be a patient responsible charge related to this service. Patient Location: Home Provider Location: Office Additional Individuals present: None  HPI  IDA: Likely secondary to menorrhagia; seeing Dr. Feliberto Gottron with OB/GYN and Dr. Smith Robert with hematology. Had IUD placed and periods have been significantly lighter.  Finished her Iron infusions (feraheme).  Review of her labs shows great improvement s/p infusions.  She denies fatigue, no PICA.  No changes.  Prediabetes/Obesity: Had gestational DM when pregnant; has since has mild prediabetes.  Had A1C in April 2020 that was 5.6%.  She tried metformin without weight loss, then went on victoza and has only lost a few pounds, also does not tolerate daily injections. She walks for about 2 miles each day, doing little workouts from [place].  Was given phentermine by her GYN and this made her jittery.   She denies polyuria, polydipsia, or polyphagia.  She has cut out sodas, drinking plenty of water. Denies family history of thyroid cancer; no personal history of pancreatitis or thyroid cancer. she is scheduled for Liposuction March 17, her BMI must be 34 to be able to have surgery.  We will switch to ozempic; does not want to talk to nutritionist.  She will send forms for lipo-suction labs.  HS: Is off Humira; she had surgery last year to  bilateral axilla to remove her sweat glands.  No abscesses or skin infections since October. No longer follow up with Dermatology with Dr. Janyth Contes through [facility]. Doing well with no recent flare ups.  Patient Active Problem List   Diagnosis Date Noted  . Menorrhagia with regular cycle 04/08/2019  . Prediabetes 04/08/2019  . Class 2 severe obesity due to excess calories with serious comorbidity and body mass index (BMI) of 38.0 to 38.9 in adult (HCC) 04/08/2019  . Iron deficiency anemia 10/12/2018  . DJD (degenerative joint disease), lumbosacral 08/05/2016  . Annular tear of lumbar disc 08/05/2016  . Hidradenitis suppurativa 08/05/2016    Past Surgical History:  Procedure Laterality Date  . ABDOMINAL SURGERY    . BREAST CYST ASPIRATION Right 2011   abcess drained  . CESAREAN SECTION     x2  . CHOLECYSTECTOMY    . TUBAL LIGATION      Family History  Problem Relation Age of Onset  . Diabetes Mother     Social History   Socioeconomic History  . Marital status: Single    Spouse name: Not on file  . Number of children: 2  . Years of education: Not on file  . Highest education level: Not on file  Occupational History  . Not on file  Tobacco Use  . Smoking status: Never Smoker  . Smokeless tobacco: Never Used  Substance and Sexual Activity  . Alcohol use: No    Comment: occassional in social  . Drug use: Never  .  Sexual activity: Yes    Partners: Male    Birth control/protection: I.U.D.  Other Topics Concern  . Not on file  Social History Narrative  . Not on file   Social Determinants of Health   Financial Resource Strain: Low Risk   . Difficulty of Paying Living Expenses: Not hard at all  Food Insecurity: No Food Insecurity  . Worried About Programme researcher, broadcasting/film/video in the Last Year: Never true  . Ran Out of Food in the Last Year: Never true  Transportation Needs: No Transportation Needs  . Lack of Transportation (Medical): No  . Lack of Transportation (Non-Medical): No    Physical Activity: Sufficiently Active  . Days of Exercise per Week: 5 days  . Minutes of Exercise per Session: 30 min  Stress: No Stress Concern Present  . Feeling of Stress : Only a little  Social Connections: Somewhat Isolated  . Frequency of Communication with Friends and Family: More than three times a week  . Frequency of Social Gatherings with Friends and Family: Never  . Attends Religious Services: More than 4 times per year  . Active Member of Clubs or Organizations: No  . Attends Banker Meetings: Never  . Marital Status: Never married  Intimate Partner Violence: Not At Risk  . Fear of Current or Ex-Partner: No  . Emotionally Abused: No  . Physically Abused: No  . Sexually Abused: No     Current Outpatient Medications:  .  Insulin Pen Needle (NOVOFINE) 32G X 6 MM MISC, 1 each by Does not apply route daily., Disp: 100 each, Rfl: 3 .  Adalimumab (HUMIRA PEN) 40 MG/0.4ML PNKT, Inject 1 Dose into the skin once a week., Disp: , Rfl:  .  ibuprofen (ADVIL,MOTRIN) 800 MG tablet, Take 1 tablet (800 mg total) by mouth every 8 (eight) hours as needed for moderate pain. (Patient not taking: Reported on 07/12/2019), Disp: 15 tablet, Rfl: 0 .  liraglutide (VICTOZA) 18 MG/3ML SOPN, Inject 0.6mg  subq once daily x7 days, then increase to 1.2mg  subq once daily. (Patient not taking: Reported on 07/12/2019), Disp: 3 mL, Rfl: 3  Allergies  Allergen Reactions  . Penicillins     I personally reviewed active problem list, medication list, allergies, notes from last encounter, lab results with the patient/caregiver today.   ROS  Constitutional: Negative for fever or weight change.  Respiratory: Negative for cough and shortness of breath.   Cardiovascular: Negative for chest pain or palpitations.  Gastrointestinal: Negative for abdominal pain, no bowel changes.  Musculoskeletal: Negative for gait problem or joint swelling.  Skin: Negative for rash.  Neurological: Negative for  dizziness or headache.  No other specific complaints in a complete review of systems (except as listed in HPI above).  Objective  Virtual encounter, vitals not obtained.  Body mass index is 38.11 kg/m.  Physical Exam  Constitutional: Patient appears well-developed and well-nourished. No distress.  HENT: Head: Normocephalic and atraumatic.  Neck: Normal range of motion. Pulmonary/Chest: Effort normal. No respiratory distress. Speaking in complete sentences Neurological: Pt is alert and oriented to person, place, and time. Coordination, speech and gait are normal.  Psychiatric: Patient has a normal mood and affect. behavior is normal. Judgment and thought content normal.  No results found for this or any previous visit (from the past 72 hour(s)).  PHQ2/9: Depression screen [facility] 2/9 07/12/2019 04/08/2019  Decreased Interest 0 0  Down, Depressed, Hopeless 0 0  PHQ - 2 Score 0 0  Altered sleeping 0  0  Tired, decreased energy 0 0  Change in appetite 0 0  Feeling bad or failure about yourself  0 0  Trouble concentrating 0 0  Moving slowly or fidgety/restless 0 0  Suicidal thoughts 0 0  PHQ-9 Score 0 0  Difficult doing work/chores Not difficult at all Not difficult at all   PHQ-2/9 Result is negative.    Fall Risk: Fall Risk  07/12/2019 07/01/2019 06/28/2019 04/08/2019  Falls in the past year? 0 0 0 0  Number falls in past yr: 0 - - 0  Injury with Fall? 0 - - 0  Follow up - - - Falls evaluation completed    Assessment & Plan  1. Prediabetes - Switch to weekly ozempic; discussed dietary modifications in great detail with the patient, will shoot for <100g carbs per day and will try intermittent fasting. - Semaglutide,0.25 or 0.5MG /DOS, (OZEMPIC, 0.25 OR 0.5 MG/DOSE,) 2 MG/1.5ML SOPN; Inject 0.25 mg into the skin once a week for 28 days, THEN 0.5 mg once a week.  Dispense: 3 pen; Refill: 3  2. Class 2 severe obesity due to excess calories with serious comorbidity and body mass index  (BMI) of 38.0 to 38.9 in adult [facility]) - Discussed with the patient the risk posed by an increased BMI. Discussed importance of portion control, calorie counting and at least 150 minutes of physical activity weekly. Avoid sweet beverages and drink more water. Eat at least 6 servings of fruit and vegetables daily  - Semaglutide,0.25 or 0.5MG /DOS, (OZEMPIC, 0.25 OR 0.5 MG/DOSE,) 2 MG/1.5ML SOPN; Inject 0.25 mg into the skin once a week for 28 days, THEN 0.5 mg once a week.  Dispense: 3 pen; Refill: 3  3. Iron deficiency anemia, unspecified iron deficiency anemia type - Improving with iron infusions and IUD in place  4. Hidradenitis suppurativa - Not on humira, no recent flares   I discussed the assessment and treatment plan with the patient. The patient was provided an opportunity to ask questions and all were answered. The patient agreed with the plan and demonstrated an understanding of the instructions.   The patient was advised to call back or seek an in-person evaluation if the symptoms worsen or if the condition fails to improve as anticipated.  I provided 16 minutes of non-face-to-face time during this encounter.  Hubbard Hartshorn, FNP

## 2019-07-19 ENCOUNTER — Ambulatory Visit: Payer: Commercial Managed Care - PPO | Admitting: Family Medicine

## 2019-07-19 ENCOUNTER — Encounter: Payer: Self-pay | Admitting: Family Medicine

## 2019-07-19 ENCOUNTER — Ambulatory Visit
Admission: RE | Admit: 2019-07-19 | Discharge: 2019-07-19 | Disposition: A | Discharge status: Home / Self Care | Payer: Commercial Managed Care - PPO | Source: Ambulatory Visit | Attending: Family Medicine | Admitting: Family Medicine

## 2019-07-19 ENCOUNTER — Other Ambulatory Visit: Payer: Self-pay

## 2019-07-19 VITALS — BP 112/80 | HR 68 | Temp 97.6°F | Resp 16 | Ht 68.0 in | Wt 249.7 lb

## 2019-07-19 DIAGNOSIS — Z01818 Encounter for other preprocedural examination: Secondary | ICD-10-CM | POA: Diagnosis not present

## 2019-07-19 DIAGNOSIS — R7303 Prediabetes: Secondary | ICD-10-CM

## 2019-07-19 DIAGNOSIS — D509 Iron deficiency anemia, unspecified: Secondary | ICD-10-CM | POA: Diagnosis not present

## 2019-07-19 DIAGNOSIS — Z6838 Body mass index (BMI) 38.0-38.9, adult: Secondary | ICD-10-CM

## 2019-07-19 NOTE — Progress Notes (Signed)
Name: Shannon Escobar   MRN: [id number]    DOB: October 03, 1975   Date:07/19/2019       Progress Note  Subjective  Chief Complaint  Chief Complaint  Patient presents with  . Procedure    Surgical Clearance for Lipo and BBL    HPI  PT presents for pre-op clearance.  She is having [place] and Lipsuction by [employer] with planned date 09/08/19.  She has been cutting out carbohydrates - eating <100 carbohydrates, she has lost 4lbs on her scale at home (245lbs), but still needs to be at 235lbs to have the surgery.  Discussed healthy diet, she is working out daily and trying to stay very active at home.  Has not started Ozempic that was prescribed for her prediabetes and to aid in weight loss - is picking up after her visit today. - Medical History includes prediabetes and IDA.  We will check CBC per request today, also due for A1C.  Her anemia has been stable since obtaining iron infusions; still taking OTC iron supplement as well. - Labs will be ordered per [facility] request, EKG, and CXR.  EKG shows sinus rhythm with no concerns.   Patient Active Problem List   Diagnosis Date Noted  . Menorrhagia with regular cycle 04/08/2019  . Prediabetes 04/08/2019  . Class 2 severe obesity due to excess calories with serious comorbidity and body mass index (BMI) of 38.0 to 38.9 in adult (Real) 04/08/2019  . Iron deficiency anemia 10/12/2018  . DJD (degenerative joint disease), lumbosacral 08/05/2016  . Annular tear of lumbar disc 08/05/2016  . Hidradenitis suppurativa 08/05/2016    Past Surgical History:  Procedure Laterality Date  . ABDOMINAL SURGERY    . BREAST CYST ASPIRATION Right 2011   abcess drained  . CESAREAN SECTION     x2  . CHOLECYSTECTOMY    . TUBAL LIGATION      Family History  Problem Relation Age of Onset  . Diabetes Mother     Social History   Socioeconomic History  . Marital status: Single    Spouse name: Not on file  . Number of children: 2  .  Years of education: Not on file  . Highest education level: Not on file  Occupational History  . Not on file  Tobacco Use  . Smoking status: Never Smoker  . Smokeless tobacco: Never Used  Substance and Sexual Activity  . Alcohol use: No    Comment: occassional in social  . Drug use: Never  . Sexual activity: Yes    Partners: Male    Birth control/protection: I.U.D.  Other Topics Concern  . Not on file  Social History Narrative  . Not on file   Social Determinants of Health   Financial Resource Strain: Low Risk   . Difficulty of Paying Living Expenses: Not hard at all  Food Insecurity: No Food Insecurity  . Worried About Charity fundraiser in the Last Year: Never true  . Ran Out of Food in the Last Year: Never true  Transportation Needs: No Transportation Needs  . Lack of Transportation (Medical): No  . Lack of Transportation (Non-Medical): No  Physical Activity: Sufficiently Active  . Days of Exercise per Week: 5 days  . Minutes of Exercise per Session: 30 min  Stress: No Stress Concern Present  . Feeling of Stress : Only a little  Social Connections: Somewhat Isolated  . Frequency of Communication with Friends and Family: More than three times  a week  . Frequency of Social Gatherings with Friends and Family: Never  . Attends Religious Services: More than 4 times per year  . Active Member of Clubs or Organizations: No  . Attends Banker Meetings: Never  . Marital Status: Never married  Intimate Partner Violence: Not At Risk  . Fear of Current or Ex-Partner: No  . Emotionally Abused: No  . Physically Abused: No  . Sexually Abused: No     Current Outpatient Medications:  .  Insulin Pen Needle (NOVOFINE) 32G X 6 MM MISC, 1 each by Does not apply route daily. (Patient not taking: Reported on 07/19/2019), Disp: 100 each, Rfl: 3 .  Semaglutide,0.25 or 0.5MG /DOS, (OZEMPIC, 0.25 OR 0.5 MG/DOSE,) 2 MG/1.5ML SOPN, Inject 0.25 mg into the skin once a week for 28  days, THEN 0.5 mg once a week. (Patient not taking: Reported on 07/19/2019), Disp: 3 pen, Rfl: 3  Allergies  Allergen Reactions  . Penicillins     I personally reviewed active problem list, medication list, allergies, notes from last encounter, lab results with the patient/caregiver today.   ROS  Constitutional: Negative for fever or weight change.  Respiratory: Negative for cough and shortness of breath.   Cardiovascular: Negative for chest pain or palpitations.  Gastrointestinal: Negative for abdominal pain, no bowel changes.  Musculoskeletal: Negative for gait problem or joint swelling.  Skin: Negative for rash.  Neurological: Negative for dizziness or headache.  No other specific complaints in a complete review of systems (except as listed in HPI above).  Objective  Vitals:   07/19/19 0817  BP: 112/80  Pulse: 68  Resp: 16  Temp: 97.6 F (36.4 C)  Weight: 249 lb 11.2 oz (113.3 kg)  Height: 5\' 8"  (1.727 m)    Body mass index is 37.97 kg/m.  Physical Exam  Constitutional: Patient appears well-developed and well-nourished. No distress.  HENT: Head: Normocephalic and atraumatic. Ears: bilateral TMs with no erythema or effusion; Nose: Nose normal. Mouth/Throat: Oropharynx is clear and moist. No oropharyngeal exudate or tonsillar swelling.  Eyes: Conjunctivae and EOM are normal. No scleral icterus.  Pupils are equal, round, and reactive to light.  Neck: Normal range of motion. Neck supple. No JVD present. No thyromegaly present.  Cardiovascular: Normal rate, regular rhythm and normal heart sounds.  No murmur heard. No BLE edema. Pulmonary/Chest: Effort normal and breath sounds normal. No respiratory distress. Abdominal: Soft. Bowel sounds are normal, no distension. There is no tenderness. No masses. Musculoskeletal: Normal range of motion, no joint effusions. No gross deformities Neurological: Pt is alert and oriented to person, place, and time. No cranial nerve deficit.  Coordination, balance, strength, speech and gait are normal.  Skin: Skin is warm and dry. No rash noted. No erythema.  Psychiatric: Patient has a normal mood and affect. behavior is normal. Judgment and thought content normal.  No results found for this or any previous visit (from the past 72 hour(s)).   PHQ2/9: Depression screen [facility] 2/9 07/19/2019 07/12/2019 04/08/2019  Decreased Interest 0 0 0  Down, Depressed, Hopeless 0 0 0  PHQ - 2 Score 0 0 0  Altered sleeping 0 0 0  Tired, decreased energy 0 0 0  Change in appetite 0 0 0  Feeling bad or failure about yourself  0 0 0  Trouble concentrating 0 0 0  Moving slowly or fidgety/restless 0 0 0  Suicidal thoughts 0 0 0  PHQ-9 Score 0 0 0  Difficult doing work/chores Not difficult at  all Not difficult at all Not difficult at all   PHQ-2/9 Result is negative.    Fall Risk: Fall Risk  07/19/2019 07/12/2019 07/01/2019 06/28/2019 04/08/2019  Falls in the past year? 0 0 0 0 0  Number falls in past yr: 0 0 - - 0  Injury with Fall? 0 0 - - 0  Follow up Falls evaluation completed - - - Falls evaluation completed   Assessment & Plan  1. Preoperative clearance - CBC with Differential/Platelet - COMPLETE METABOLIC PANEL WITH GFR - Urinalysis, Complete - HIV Antibody (routine testing w rflx) - DG Chest 2 View; Future - INR/PT - PTT - hCG, quantitative, pregnancy - EKG 12-Lead  2. Prediabetes - Hemoglobin A1c  3. Class 2 severe obesity due to excess calories with serious comorbidity and body mass index (BMI) of 38.0 to 38.9 in adult [facility]) - Discussed with the patient the risk posed by an increased BMI. Discussed importance of portion control, calorie counting and at least 150 minutes of physical activity weekly. Avoid sweet beverages and drink more water. Eat at least 6 servings of fruit and vegetables daily  - COMPLETE METABOLIC PANEL WITH GFR - Hemoglobin A1c  4. Iron deficiency anemia, unspecified iron deficiency anemia type - CBC with  Differential/Platelet

## 2019-07-19 NOTE — Patient Instructions (Signed)
Low fat diet, Keep carbs under 75 grams per day. Continue working out

## 2019-07-20 LAB — COMPLETE METABOLIC PANEL WITH GFR
AG Ratio: 1.1 (calc) (ref 1.0–2.5)
ALT: 26 U/L (ref 6–29)
AST: 17 U/L (ref 10–30)
Albumin: 3.8 g/dL (ref 3.6–5.1)
Alkaline phosphatase (APISO): 97 U/L (ref 31–125)
BUN: 12 mg/dL (ref 7–25)
CO2: 27 mmol/L (ref 20–32)
Calcium: 9.4 mg/dL (ref 8.6–10.2)
Chloride: 105 mmol/L (ref 98–110)
Creat: 0.7 mg/dL (ref 0.50–1.10)
GFR, Est African American: 123 mL/min/{1.73_m2} (ref >=60)
GFR, Est Non African American: 106 mL/min/{1.73_m2} (ref >=60)
Globulin: 3.5 g/dL (calc) (ref 1.9–3.7)
Glucose, Bld: 99 mg/dL (ref 65–99)
Potassium: 4 mmol/L (ref 3.5–5.3)
Sodium: 139 mmol/L (ref 135–146)
Total Bilirubin: 0.5 mg/dL (ref 0.2–1.2)
Total Protein: 7.3 g/dL (ref 6.1–8.1)

## 2019-07-20 LAB — CBC WITH DIFFERENTIAL/PLATELET
Absolute Monocytes: 398 cells/uL (ref 200–950)
Basophils Absolute: 31 cells/uL (ref 0–200)
Basophils Relative: 0.4 %
Eosinophils Absolute: 78 cells/uL (ref 15–500)
Eosinophils Relative: 1 %
HCT: 42.8 % (ref 35.0–45.0)
Hemoglobin: 14.5 g/dL (ref 11.7–15.5)
Lymphs Abs: 1061 cells/uL (ref 850–3900)
MCH: 29.4 pg (ref 27.0–33.0)
MCHC: 33.9 g/dL (ref 32.0–36.0)
MCV: 86.8 fL (ref 80.0–100.0)
MPV: 11.1 fL (ref 7.5–12.5)
Monocytes Relative: 5.1 %
Neutro Abs: 6232 cells/uL (ref 1500–7800)
Neutrophils Relative %: 79.9 %
Platelets: 255 10*3/uL (ref 140–400)
RBC: 4.93 10*6/uL (ref 3.80–5.10)
RDW: 14.6 % (ref 11.0–15.0)
Total Lymphocyte: 13.6 %
WBC: 7.8 10*3/uL (ref 3.8–10.8)

## 2019-07-20 LAB — HEMOGLOBIN A1C
Hgb A1c MFr Bld: 5.8 % of total Hgb — ABNORMAL HIGH (ref <5.7)
Mean Plasma Glucose: 120 (calc)
eAG (mmol/L): 6.6 (calc)

## 2019-07-20 LAB — APTT: aPTT: 31 s (ref 23–32)

## 2019-07-20 LAB — URINALYSIS, COMPLETE
Bacteria, UA: NONE SEEN /HPF
Bilirubin Urine: NEGATIVE
Glucose, UA: NEGATIVE
Hgb urine dipstick: NEGATIVE
Hyaline Cast: NONE SEEN /LPF
Ketones, ur: NEGATIVE
Leukocytes,Ua: NEGATIVE
Nitrite: NEGATIVE
Protein, ur: NEGATIVE
Specific Gravity, Urine: 1.021 (ref 1.001–1.03)
WBC, UA: NONE SEEN /HPF (ref 0–5)
pH: 5.5 (ref 5.0–8.0)

## 2019-07-20 LAB — HCG, QUANTITATIVE, PREGNANCY: HCG, Total, QN: <3 m[IU]/mL

## 2019-07-20 LAB — PROTIME-INR
INR: 1.1
Prothrombin Time: 11.1 s (ref 9.0–11.5)

## 2019-07-20 LAB — HIV ANTIBODY (ROUTINE TESTING W REFLEX): HIV 1&2 Ab, 4th Generation: NONREACTIVE

## 2019-07-28 ENCOUNTER — Other Ambulatory Visit: Payer: Self-pay

## 2019-07-29 ENCOUNTER — Inpatient Hospital Stay: Payer: Commercial Managed Care - PPO | Attending: Oncology

## 2019-07-29 ENCOUNTER — Other Ambulatory Visit: Payer: Self-pay

## 2019-07-29 DIAGNOSIS — D509 Iron deficiency anemia, unspecified: Secondary | ICD-10-CM | POA: Diagnosis not present

## 2019-07-29 LAB — CBC WITH DIFFERENTIAL/PLATELET
Abs Immature Granulocytes: 0.02 10*3/uL (ref 0.00–0.07)
Basophils Absolute: 0 10*3/uL (ref 0.0–0.1)
Basophils Relative: 0 %
Eosinophils Absolute: 0.1 10*3/uL (ref 0.0–0.5)
Eosinophils Relative: 2 %
HCT: 46.6 % — ABNORMAL HIGH (ref 36.0–46.0)
Hemoglobin: 15.2 g/dL — ABNORMAL HIGH (ref 12.0–15.0)
Immature Granulocytes: 0 %
Lymphocytes Relative: 20 %
Lymphs Abs: 1.5 10*3/uL (ref 0.7–4.0)
MCH: 29.5 pg (ref 26.0–34.0)
MCHC: 32.6 g/dL (ref 30.0–36.0)
MCV: 90.3 fL (ref 80.0–100.0)
Monocytes Absolute: 0.3 10*3/uL (ref 0.1–1.0)
Monocytes Relative: 4 %
Neutro Abs: 5.6 10*3/uL (ref 1.7–7.7)
Neutrophils Relative %: 74 %
Platelets: 270 10*3/uL (ref 150–400)
RBC: 5.16 MIL/uL — ABNORMAL HIGH (ref 3.87–5.11)
RDW: 15.1 % (ref 11.5–15.5)
WBC: 7.5 10*3/uL (ref 4.0–10.5)
nRBC: 0 % (ref 0.0–0.2)

## 2019-07-29 LAB — FERRITIN: Ferritin: 160 ng/mL (ref 11–307)

## 2019-07-29 LAB — IRON AND TIBC
Iron: 62 ug/dL (ref 28–170)
Saturation Ratios: 17 % (ref 10.4–31.8)
TIBC: 367 ug/dL (ref 250–450)
UIBC: 305 ug/dL

## 2019-08-13 LAB — HM PAP SMEAR: HM Pap smear: POSITIVE

## 2019-10-25 ENCOUNTER — Other Ambulatory Visit: Payer: Self-pay

## 2019-10-25 ENCOUNTER — Inpatient Hospital Stay: Payer: Commercial Managed Care - PPO | Attending: Oncology

## 2019-10-25 DIAGNOSIS — D509 Iron deficiency anemia, unspecified: Secondary | ICD-10-CM | POA: Diagnosis present

## 2019-10-25 LAB — CBC WITH DIFFERENTIAL/PLATELET
Abs Immature Granulocytes: 0.02 10*3/uL (ref 0.00–0.07)
Basophils Absolute: 0 10*3/uL (ref 0.0–0.1)
Basophils Relative: 0 %
Eosinophils Absolute: 0.1 10*3/uL (ref 0.0–0.5)
Eosinophils Relative: 1 %
HCT: 45.7 % (ref 36.0–46.0)
Hemoglobin: 15.4 g/dL — ABNORMAL HIGH (ref 12.0–15.0)
Immature Granulocytes: 0 %
Lymphocytes Relative: 16 %
Lymphs Abs: 1.2 10*3/uL (ref 0.7–4.0)
MCH: 30.1 pg (ref 26.0–34.0)
MCHC: 33.7 g/dL (ref 30.0–36.0)
MCV: 89.4 fL (ref 80.0–100.0)
Monocytes Absolute: 0.4 10*3/uL (ref 0.1–1.0)
Monocytes Relative: 5 %
Neutro Abs: 5.8 10*3/uL (ref 1.7–7.7)
Neutrophils Relative %: 78 %
Platelets: 272 10*3/uL (ref 150–400)
RBC: 5.11 MIL/uL (ref 3.87–5.11)
RDW: 13.6 % (ref 11.5–15.5)
WBC: 7.5 10*3/uL (ref 4.0–10.5)
nRBC: 0 % (ref 0.0–0.2)

## 2019-10-25 LAB — FERRITIN: Ferritin: 195 ng/mL (ref 11–307)

## 2019-10-25 LAB — IRON AND TIBC
Iron: 85 ug/dL (ref 28–170)
Saturation Ratios: 27 % (ref 10.4–31.8)
TIBC: 311 ug/dL (ref 250–450)
UIBC: 226 ug/dL

## 2019-10-27 ENCOUNTER — Encounter: Payer: Self-pay | Admitting: Oncology

## 2019-10-27 ENCOUNTER — Other Ambulatory Visit: Payer: Commercial Managed Care - PPO

## 2019-10-27 ENCOUNTER — Inpatient Hospital Stay (HOSPITAL_BASED_OUTPATIENT_CLINIC_OR_DEPARTMENT_OTHER): Payer: Commercial Managed Care - PPO | Admitting: Oncology

## 2019-10-27 DIAGNOSIS — D509 Iron deficiency anemia, unspecified: Secondary | ICD-10-CM | POA: Diagnosis not present

## 2019-10-27 NOTE — Progress Notes (Signed)
Patient called/ pre- screened for virtual appoinment today with oncologist. No concerns or  complaints at this time. 

## 2019-10-31 NOTE — Progress Notes (Signed)
I connected with Shannon Escobar on 10/31/19 at  2:45 PM EDT by video enabled telemedicine visit and verified that I am speaking with the correct person using two identifiers.   I discussed the limitations, risks, security and privacy concerns of performing an evaluation and management service by telemedicine and the availability of in-person appointments. I also discussed with the patient that there may be a patient responsible charge related to this service. The patient expressed understanding and agreed to proceed.  Other persons participating in the visit and their role in the encounter:  none  Patient's location:  home Provider's location:  work  Stage manager Complaint: Routine follow-up of iron deficiency anemia  History of present illness: Patient is a 44 year old African-American female who has been referred to [place] for anemia.Most recent CBC from 09/20/2018 showed white count of 7.3, H&H of 6.5/25.7 with an MCV of 58.8 and a platelet count of 343. CMP was within normal limits. TSH was normal. Iron study showed a ferritin of 1. Of note patient also had a CBC back in August 2019 which showed a white count of 6.7, H&H of 6.8/25.1 with an MCV of 57 and a platelet count of 396.Patient has a history of fibroids and recent ultrasound showed she had 6 fibroids ranging from 2 to 5 cm.  Patient's iron deficiency anemia was attributed to her menorrhagia.    Interval history: Patient is currently doing well and denies any complaints at this time.  Denies any blood loss in her stool or urine.   Review of Systems  Constitutional: Negative for chills, fever, malaise/fatigue and weight loss.  HENT: Negative for congestion, ear discharge and nosebleeds.   Eyes: Negative for blurred vision.  Respiratory: Negative for cough, hemoptysis, sputum production, shortness of breath and wheezing.   Cardiovascular: Negative for chest pain, palpitations, orthopnea and claudication.  Gastrointestinal: Negative for  abdominal pain, blood in stool, constipation, diarrhea, heartburn, melena, nausea and vomiting.  Genitourinary: Negative for dysuria, flank pain, frequency, hematuria and urgency.  Musculoskeletal: Negative for back pain, joint pain and myalgias.  Skin: Negative for rash.  Neurological: Negative for dizziness, tingling, focal weakness, seizures, weakness and headaches.  Endo/Heme/Allergies: Does not bruise/bleed easily.  Psychiatric/Behavioral: Negative for depression and suicidal ideas. The patient does not have insomnia.     Allergies  Allergen Reactions  . Penicillins     Past Medical History:  Diagnosis Date  . Anemia   . Diabetes mellitus without complication (HCC)    had gestational diabetes    Past Surgical History:  Procedure Laterality Date  . ABDOMINAL SURGERY    . BREAST CYST ASPIRATION Right 2011   abcess drained  . CESAREAN SECTION     x2  . CHOLECYSTECTOMY    . TUBAL LIGATION      Social History   Socioeconomic History  . Marital status: Single    Spouse name: Not on file  . Number of children: 2  . Years of education: Not on file  . Highest education level: Not on file  Occupational History  . Not on file  Tobacco Use  . Smoking status: Never Smoker  . Smokeless tobacco: Never Used  Substance and Sexual Activity  . Alcohol use: No    Comment: occassional in social  . Drug use: Never  . Sexual activity: Yes    Partners: Male    Birth control/protection: I.U.D.  Other Topics Concern  . Not on file  Social History Narrative  . Not on file  Social Determinants of Health   Financial Resource Strain: Low Risk   . Difficulty of Paying Living Expenses: Not hard at all  Food Insecurity: No Food Insecurity  . Worried About Programme researcher, broadcasting/film/video in the Last Year: Never true  . Ran Out of Food in the Last Year: Never true  Transportation Needs: No Transportation Needs  . Lack of Transportation (Medical): No  . Lack of Transportation (Non-Medical):  No  Physical Activity: Sufficiently Active  . Days of Exercise per Week: 5 days  . Minutes of Exercise per Session: 30 min  Stress: No Stress Concern Present  . Feeling of Stress : Only a little  Social Connections: Somewhat Isolated  . Frequency of Communication with Friends and Family: More than three times a week  . Frequency of Social Gatherings with Friends and Family: Never  . Attends Religious Services: More than 4 times per year  . Active Member of Clubs or Organizations: No  . Attends Banker Meetings: Never  . Marital Status: Never married  Intimate Partner Violence: Not At Risk  . Fear of Current or Ex-Partner: No  . Emotionally Abused: No  . Physically Abused: No  . Sexually Abused: No    Family History  Problem Relation Age of Onset  . Diabetes Mother      Current Outpatient Medications:  .  Insulin Pen Needle (NOVOFINE) 32G X 6 MM MISC, 1 each by Does not apply route daily., Disp: 100 each, Rfl: 3 .  Semaglutide,0.25 or 0.5MG /DOS, (OZEMPIC, 0.25 OR 0.5 MG/DOSE,) 2 MG/1.5ML SOPN, Inject 0.25 mg into the skin once a week for 28 days, THEN 0.5 mg once a week., Disp: 3 pen, Rfl: 3  No results found.  No images are attached to the encounter.   CMP Latest Ref Rng & Units 07/19/2019  Glucose 65 - 99 mg/dL 99  BUN 7 - 25 mg/dL 12  Creatinine 8.85 - 0.27 mg/dL 7.41  Sodium 287 - 867 mmol/L 139  Potassium 3.5 - 5.3 mmol/L 4.0  Chloride 98 - 110 mmol/L 105  CO2 20 - 32 mmol/L 27  Calcium 8.6 - 10.2 mg/dL 9.4  Total Protein 6.1 - 8.1 g/dL 7.3  Total Bilirubin 0.2 - 1.2 mg/dL 0.5  Alkaline Phos 38 - 126 U/L -  AST 10 - 30 U/L 17  ALT 6 - 29 U/L 26   CBC Latest Ref Rng & Units 10/25/2019  WBC 4.0 - 10.5 K/uL 7.5  Hemoglobin 12.0 - 15.0 g/dL 15.4(H)  Hematocrit 36.0 - 46.0 % 45.7  Platelets 150 - 400 K/uL 272     Observation/objective: Appears in no acute distress over video visit today.  Breathing is nonlabored  Assessment and plan:Patient is a  44 year old female with history of iron deficiency anemia possibly secondary to menorrhagia.  This is a routine follow-up visit  Patient's hemoglobin has remained stable between 14-15 over the last 6 months.  Prior to that it was around 13.   Follow-up instructions: Given the stability of her hemoglobin I plan to repeat CBC ferritin and iron studies in 4 and 8 months and see her back in 8 months.  I discussed the assessment and treatment plan with the patient. The patient was provided an opportunity to ask questions and all were answered. The patient agreed with the plan and demonstrated an understanding of the instructions.   The patient was advised to call back or seek an in-person evaluation if the symptoms worsen or if the condition  fails to improve as anticipated.  She does not require any IV iron at this time.  Her iron studies are within normal limits. Visit Diagnosis: 1. Iron deficiency anemia, unspecified iron deficiency anemia type     Dr. Randa Evens, MD, MPH [facility] at [facility] Tel- [phone] 10/31/2019 9:49 AM

## 2019-11-04 ENCOUNTER — Ambulatory Visit: Payer: Commercial Managed Care - PPO | Admitting: Family Medicine

## 2019-11-15 ENCOUNTER — Ambulatory Visit
Admission: RE | Admit: 2019-11-15 | Discharge: 2019-11-15 | Disposition: A | Discharge status: Home / Self Care | Payer: Commercial Managed Care - PPO | Source: Ambulatory Visit | Attending: Family Medicine | Admitting: Family Medicine

## 2019-11-15 ENCOUNTER — Other Ambulatory Visit: Payer: Self-pay

## 2019-11-15 ENCOUNTER — Ambulatory Visit: Payer: Commercial Managed Care - PPO | Admitting: Family Medicine

## 2019-11-15 ENCOUNTER — Encounter: Payer: Self-pay | Admitting: Family Medicine

## 2019-11-15 VITALS — BP 120/76 | HR 76 | Temp 97.9°F | Resp 16 | Ht 68.0 in | Wt 239.1 lb

## 2019-11-15 DIAGNOSIS — E669 Obesity, unspecified: Secondary | ICD-10-CM

## 2019-11-15 DIAGNOSIS — Z01818 Encounter for other preprocedural examination: Secondary | ICD-10-CM | POA: Insufficient documentation

## 2019-11-15 DIAGNOSIS — D509 Iron deficiency anemia, unspecified: Secondary | ICD-10-CM

## 2019-11-15 DIAGNOSIS — R7303 Prediabetes: Secondary | ICD-10-CM | POA: Diagnosis not present

## 2019-11-15 NOTE — Progress Notes (Signed)
Name: Shannon Escobar   MRN: [id number]    DOB: 04-29-1976   Date:11/15/2019       Progress Note  Subjective  Chief Complaint  Chief Complaint  Patient presents with  . Procedure    Preop for Lipo 360 on July 15    HPI  PT presents for pre-op clearance. She was scheduled to have   [place] and Liposuction by [employer], however she decided to post-pone until July to get her BMI down to 34, today her BMI is 36  She changed her diet to a liquid diet three days a week. She states she changed it two months ago and has lost 8 lbs since. She took Ozempic but states it did not help her lose weight. She denies previous complications from surgery, she has a normal exercise tolerance, able to perform 8 METS of activity without problem. Able to go to the gym, boot camps, she has all her teeth.  - Medical History includes prediabetes and IDA.  Seeing hematologist and no longer has anemia and iron storage is at goal   We will order labs and studies as requested by surgeon  Pre-diabetes: A1C has been slightly elevated, last level 5.8 % she denies polyphagia, polydipsia or polyuria   Obesity: she states she has been obese since she got pregnant, first baby was born when she was 30. Her weight went from 170 lbs to 200 lbs range. She states her heaviest weight was 240 lbs. She has bene trying to lose weight, states having liposuction will help her feel better about herself. Explained it may not work if she is unable to keep a healthy diet.     Patient Active Problem List   Diagnosis Date Noted  . Menorrhagia with regular cycle 04/08/2019  . Prediabetes 04/08/2019  . Class 2 severe obesity due to excess calories with serious comorbidity and body mass index (BMI) of 38.0 to 38.9 in adult (HCC) 04/08/2019  . Iron deficiency anemia 10/12/2018  . DJD (degenerative joint disease), lumbosacral 08/05/2016  . Annular tear of lumbar disc 08/05/2016  . Hidradenitis suppurativa 08/05/2016    Past  Surgical History:  Procedure Laterality Date  . ABDOMINAL SURGERY    . BREAST CYST ASPIRATION Right 2011   abcess drained  . CESAREAN SECTION     x2  . CHOLECYSTECTOMY    . TUBAL LIGATION      Family History  Problem Relation Age of Onset  . Diabetes Mother     Social History   Tobacco Use  . Smoking status: Never Smoker  . Smokeless tobacco: Never Used  Substance Use Topics  . Alcohol use: No    Comment: occassional in social     Current Outpatient Medications:  .  Insulin Pen Needle (NOVOFINE) 32G X 6 MM MISC, 1 each by Does not apply route daily. (Patient not taking: Reported on 11/15/2019), Disp: 100 each, Rfl: 3  Allergies  Allergen Reactions  . Penicillins     I personally reviewed active problem list, medication list, allergies, family history, social history, health maintenance with the patient/caregiver today.   ROS  Constitutional: Negative for fever, positive for  weight change.  Respiratory: Negative for cough and shortness of breath.   Cardiovascular: Negative for chest pain or palpitations.  Gastrointestinal: Negative for abdominal pain, no bowel changes.  Musculoskeletal: Negative for gait problem or joint swelling.  Skin: Negative for rash.  Neurological: Negative for dizziness or headache.  No other specific complaints  in a complete review of systems (except as listed in HPI above).  Objective  Vitals:   11/15/19 1003  BP: 120/76  Pulse: 76  Resp: 16  Temp: 97.9 F (36.6 C)  TempSrc: Temporal  SpO2: 97%  Weight: 239 lb 1.6 oz (108.5 kg)  Height: 5\' 8"  (1.727 m)    Body mass index is 36.36 kg/m.  Physical Exam Constitutional: Patient appears well-developed and well-nourished. Obese No distress.  HEENT: head atraumatic, normocephalic, pupils equal and reactive to light,  neck supple, throat within normal limits Cardiovascular: Normal rate, regular rhythm and normal heart sounds.  No murmur heard. No BLE edema. Pulmonary/Chest:  Effort normal and breath sounds normal. No respiratory distress. Abdominal: Soft.  There is no tenderness. Psychiatric: Patient has a normal mood and affect. behavior is normal. Judgment and thought content normal.  Recent Results (from the past 2160 hour(s))  Iron and TIBC     Status: None   Collection Time: 10/25/19 10:59 AM  Result Value Ref Range   Iron 85 28 - 170 ug/dL   TIBC 311 250 - 450 ug/dL   Saturation Ratios 27 10.4 - 31.8 %   UIBC 226 ug/dL    Comment: Performed at [facility], [address] [place], [place] [address]  Ferritin     Status: None   Collection Time: 10/25/19 10:59 AM  Result Value Ref Range   Ferritin 195 11 - 307 ng/mL    Comment: Performed at [facility], [address] [place], [place] [address]  CBC diff arch     Status: Abnormal   Collection Time: 10/25/19 10:59 AM  Result Value Ref Range   WBC 7.5 4.0 - 10.5 K/uL   RBC 5.11 3.87 - 5.11 MIL/uL   Hemoglobin 15.4 (H) 12.0 - 15.0 g/dL   HCT 45.7 36.0 - 46.0 %   MCV 89.4 80.0 - 100.0 fL   MCH 30.1 26.0 - 34.0 pg   MCHC 33.7 30.0 - 36.0 g/dL   RDW 13.6 11.5 - 15.5 %   Platelets 272 150 - 400 K/uL   nRBC 0.0 0.0 - 0.2 %   Neutrophils Relative % 78 %   Neutro Abs 5.8 1.7 - 7.7 K/uL   Lymphocytes Relative 16 %   Lymphs Abs 1.2 0.7 - 4.0 K/uL   Monocytes Relative 5 %   Monocytes Absolute 0.4 0.1 - 1.0 K/uL   Eosinophils Relative 1 %   Eosinophils Absolute 0.1 0.0 - 0.5 K/uL   Basophils Relative 0 %   Basophils Absolute 0.0 0.0 - 0.1 K/uL   Immature Granulocytes 0 %   Abs Immature Granulocytes 0.02 0.00 - 0.07 K/uL    Comment: Performed at [facility], [place], [place] [address]      PHQ2/9: Depression screen [facility] 2/9 11/15/2019 07/19/2019 07/12/2019 04/08/2019  Decreased Interest 0 0 0 0  Down, Depressed, Hopeless 0 0 0 0  PHQ - 2 Score 0 0 0 0  Altered sleeping 0 0 0 0  Tired, decreased energy 0 0 0 0  Change in appetite 0 0 0 0  Feeling bad  or failure about yourself  0 0 0 0  Trouble concentrating 0 0 0 0  Moving slowly or fidgety/restless 0 0 0 0  Suicidal thoughts 0 0 0 0  PHQ-9 Score 0 0 0 0  Difficult doing work/chores Not difficult at all Not difficult at all Not difficult at all Not difficult at all  phq 9 is negative   Fall Risk: Fall Risk  11/15/2019 07/19/2019 07/12/2019 07/01/2019 06/28/2019  Falls in the past year? 0 0 0 0 0  Number falls in past yr: 0 0 0 - -  Injury with Fall? 0 0 0 - -  Follow up Falls evaluation completed Falls evaluation completed - - -    Functional Status Survey: Is the patient deaf or have difficulty hearing?: No Does the patient have difficulty seeing, even when wearing glasses/contacts?: No Does the patient have difficulty concentrating, remembering, or making decisions?: No Does the patient have difficulty walking or climbing stairs?: No Does the patient have difficulty dressing or bathing?: No Does the patient have difficulty doing errands alone such as visiting a doctor's office or shopping?: No    Assessment & Plan   1. Obesity (BMI 35.0-39.9 without comorbidity)  - EKG 12-Lead - CBC w/Diff/Platelet - INR/PT - PTT - Comprehensive Metabolic Panel (CMET) - Urinalysis - B-HCG Quant - HIV Antibody (routine testing w rflx) - DG Chest 2 View; Future  2. Pre-op exam  - EKG 12-Lead - CBC w/Diff/Platelet - INR/PT - PTT - Comprehensive Metabolic Panel (CMET) - Urinalysis - B-HCG Quant - HIV Antibody (routine testing w rflx) - DG Chest 2 View; Future  3. Iron deficiency anemia, unspecified iron deficiency anemia type  Iron storage has improved   4. Prediabetes  Last A1C was in stable

## 2019-11-16 ENCOUNTER — Encounter: Payer: Self-pay | Admitting: Family Medicine

## 2019-11-16 LAB — URINALYSIS
Bilirubin Urine: NEGATIVE
Glucose, UA: NEGATIVE
Hgb urine dipstick: NEGATIVE
Ketones, ur: NEGATIVE
Leukocytes,Ua: NEGATIVE
Nitrite: NEGATIVE
Protein, ur: NEGATIVE
Specific Gravity, Urine: 1.011 (ref 1.001–1.03)
pH: 6.5 (ref 5.0–8.0)

## 2019-11-16 LAB — APTT: aPTT: 32 s (ref 23–32)

## 2019-11-16 LAB — PROTIME-INR
INR: 1
Prothrombin Time: 11.2 s (ref 9.0–11.5)

## 2019-11-16 LAB — CBC WITH DIFFERENTIAL/PLATELET
Absolute Monocytes: 258 cells/uL (ref 200–950)
Basophils Absolute: 38 cells/uL (ref 0–200)
Basophils Relative: 0.6 %
Eosinophils Absolute: 120 cells/uL (ref 15–500)
Eosinophils Relative: 1.9 %
HCT: 48 % — ABNORMAL HIGH (ref 35.0–45.0)
Hemoglobin: 16.2 g/dL — ABNORMAL HIGH (ref 11.7–15.5)
Lymphs Abs: 1115 cells/uL (ref 850–3900)
MCH: 30.6 pg (ref 27.0–33.0)
MCHC: 33.8 g/dL (ref 32.0–36.0)
MCV: 90.7 fL (ref 80.0–100.0)
MPV: 11.2 fL (ref 7.5–12.5)
Monocytes Relative: 4.1 %
Neutro Abs: 4769 cells/uL (ref 1500–7800)
Neutrophils Relative %: 75.7 %
Platelets: 282 10*3/uL (ref 140–400)
RBC: 5.29 10*6/uL — ABNORMAL HIGH (ref 3.80–5.10)
RDW: 13.2 % (ref 11.0–15.0)
Total Lymphocyte: 17.7 %
WBC: 6.3 10*3/uL (ref 3.8–10.8)

## 2019-11-16 LAB — COMPREHENSIVE METABOLIC PANEL
AG Ratio: 1.2 (calc) (ref 1.0–2.5)
ALT: 33 U/L — ABNORMAL HIGH (ref 6–29)
AST: 20 U/L (ref 10–30)
Albumin: 4.3 g/dL (ref 3.6–5.1)
Alkaline phosphatase (APISO): 112 U/L (ref 31–125)
BUN: 7 mg/dL (ref 7–25)
CO2: 30 mmol/L (ref 20–32)
Calcium: 9.8 mg/dL (ref 8.6–10.2)
Chloride: 103 mmol/L (ref 98–110)
Creat: 0.79 mg/dL (ref 0.50–1.10)
Globulin: 3.5 g/dL (calc) (ref 1.9–3.7)
Glucose, Bld: 97 mg/dL (ref 65–99)
Potassium: 4.1 mmol/L (ref 3.5–5.3)
Sodium: 141 mmol/L (ref 135–146)
Total Bilirubin: 0.5 mg/dL (ref 0.2–1.2)
Total Protein: 7.8 g/dL (ref 6.1–8.1)

## 2019-11-16 LAB — HCG, QUANTITATIVE, PREGNANCY: HCG, Total, QN: <3 m[IU]/mL

## 2019-11-16 LAB — HIV ANTIBODY (ROUTINE TESTING W REFLEX): HIV 1&2 Ab, 4th Generation: NONREACTIVE

## 2019-11-18 ENCOUNTER — Encounter: Payer: Self-pay | Admitting: Family Medicine

## 2019-11-18 ENCOUNTER — Telehealth: Payer: Self-pay | Admitting: Family Medicine

## 2019-11-18 NOTE — Telephone Encounter (Signed)
Patient is calling to request lab results 11/15/19 labs to be sent today to fax Dr. Anne Hahn Fax [phone] Patient is request medical clearance letter forms, chest xray, to send along with the labs. [phone] Patient can also e-mail forms if needed. Patient states forms are needed by Monday, Otherwise her surgery will be canceled

## 2019-11-23 ENCOUNTER — Other Ambulatory Visit: Payer: Self-pay

## 2019-11-23 ENCOUNTER — Ambulatory Visit (INDEPENDENT_AMBULATORY_CARE_PROVIDER_SITE_OTHER): Payer: Commercial Managed Care - PPO | Admitting: Emergency Medicine

## 2019-11-23 VITALS — BP 120/76 | Ht 68.0 in | Wt 239.0 lb

## 2019-11-23 DIAGNOSIS — E669 Obesity, unspecified: Secondary | ICD-10-CM | POA: Diagnosis not present

## 2019-11-23 DIAGNOSIS — Z01818 Encounter for other preprocedural examination: Secondary | ICD-10-CM

## 2019-11-23 LAB — POCT GLYCOSYLATED HEMOGLOBIN (HGB A1C): Hemoglobin A1C: 5.5 % (ref 4.0–5.6)

## 2019-11-28 NOTE — Telephone Encounter (Signed)
Copied from CRM [id number]. Topic: Higher education careers adviser Patient (Clinic Use ONLY) >> Nov 18, 2019  1:05 PM Limmie Patricia P wrote: Reason for CRM: if pt calls back please get information of where she would like her labs faxed. >> Nov 22, 2019  1:10 PM Limmie Patricia P wrote: Please check previous messages as Aggie Cosier was working with this last week >> Nov 22, 2019 12:50 PM Daphine Deutscher D wrote: Pt called saying her surgeon received her labs except the A1C.  She ask if that could be faxed.

## 2020-01-05 ENCOUNTER — Telehealth: Payer: Self-pay | Admitting: Family Medicine

## 2020-01-05 NOTE — Telephone Encounter (Signed)
Medication Refill - Medication: phenteramine  Has the patient contacted their pharmacy? No. (Agent: If no, request that the patient contact the pharmacy for the refill.) (Agent: If yes, when and what did the pharmacy advise?)  Preferred Pharmacy (with phone number or street name): [id number] - [place] ([address]), [place] - [address]  Agent: Please be advised that RX refills may take up to 3 business days. We ask that you follow-up with your pharmacy.

## 2020-01-06 NOTE — Telephone Encounter (Signed)
Patient notified to see Dr.Sowles upon return to office

## 2020-01-10 ENCOUNTER — Ambulatory Visit: Payer: Commercial Managed Care - PPO | Admitting: Family Medicine

## 2020-01-18 ENCOUNTER — Encounter: Payer: Self-pay | Admitting: Family Medicine

## 2020-02-16 NOTE — Progress Notes (Signed)
Name: Shannon Escobar   MRN: [id number]    DOB: 1975/08/07   Date:02/17/2020       Progress Note  Subjective  Chief Complaint  Chief Complaint  Patient presents with  . Obesity    HPI    Pre-diabetes: A1C has been slightly elevated, last level 5.8 % she denies polyphagia, polydipsia or polyuria   Obesity: she states she has been obese since she got pregnant, first baby was born when she was 4. Her weight went from 170 lbs to 200 lbs range. She is currently at her maximum weight of  243 lbs. She was doing well with her diet and exercise , however got frustrated when surgery for liposuction had to be postponed due to not being at BMI below 35 and she states. She states she eats a couple times a day, she is eating bread again. She asked for phentermine, explained I can only fill that for short duration, it is not indicated for long term weight loss management. She did not like Ozempic. Also explained bp is elevated and it can cause it to be even higher with medication. Discussed contrave and Qsymia   Patient Active Problem List   Diagnosis Date Noted  . Menorrhagia with regular cycle 04/08/2019  . Prediabetes 04/08/2019  . Class 2 severe obesity due to excess calories with serious comorbidity and body mass index (BMI) of 38.0 to 38.9 in adult (HCC) 04/08/2019  . Iron deficiency anemia 10/12/2018  . DJD (degenerative joint disease), lumbosacral 08/05/2016  . Annular tear of lumbar disc 08/05/2016  . Hidradenitis suppurativa 08/05/2016    Past Surgical History:  Procedure Laterality Date  . ABDOMINAL SURGERY    . BREAST CYST ASPIRATION Right 2011   abcess drained  . CESAREAN SECTION     x2  . CHOLECYSTECTOMY    . TUBAL LIGATION      Family History  Problem Relation Age of Onset  . Diabetes Mother     Social History   Tobacco Use  . Smoking status: Never Smoker  . Smokeless tobacco: Never Used  Substance Use Topics  . Alcohol use: No    Comment: occassional in social     No current outpatient medications on file.  Allergies  Allergen Reactions  . Penicillins     I personally reviewed active problem list, medication list, allergies, family history, social history, health maintenance with the patient/caregiver today.   ROS  Constitutional: Negative for fever, positive for weight change.  Respiratory: Negative for cough and shortness of breath.   Cardiovascular: Negative for chest pain or palpitations.  Gastrointestinal: Negative for abdominal pain, no bowel changes.  Musculoskeletal: Negative for gait problem or joint swelling.  Skin: Negative for rash.  Neurological: Negative for dizziness or headache.  No other specific complaints in a complete review of systems (except as listed in HPI above).  Objective  Vitals:   02/17/20 1053 02/17/20 1106  BP: 140/90 140/84  Pulse: 82   Resp: 16   Temp: 98.6 F (37 C)   TempSrc: Oral   SpO2: 96%   Weight: 243 lb 6.4 oz (110.4 kg)   Height: 5\' 7"  (1.702 m)     Body mass index is 38.12 kg/m.  Physical Exam  Constitutional: Patient appears well-developed and well-nourished. Obese  No distress.  HEENT: head atraumatic, normocephalic, pupils equal and reactive to light, neck supple Cardiovascular: Normal rate, regular rhythm and normal heart sounds.  No murmur heard. No BLE edema. Pulmonary/Chest: Effort normal and  breath sounds normal. No respiratory distress. Abdominal: Soft.  There is no tenderness. Psychiatric: Patient has a normal mood and affect. behavior is normal. Judgment and thought content normal.  Recent Results (from the past 2160 hour(s))  POCT HgB A1C     Status: None   Collection Time: 11/23/19  7:38 AM  Result Value Ref Range   Hemoglobin A1C 5.5 4.0 - 5.6 %   HbA1c POC (<> result, manual entry)     HbA1c, POC (prediabetic range)     HbA1c, POC (controlled diabetic range)      PHQ2/9: Depression screen [facility] 2/9 02/17/2020 11/15/2019 07/19/2019 07/12/2019 04/08/2019   Decreased Interest 0 0 0 0 0  Down, Depressed, Hopeless 0 0 0 0 0  PHQ - 2 Score 0 0 0 0 0  Altered sleeping 0 0 0 0 0  Tired, decreased energy 0 0 0 0 0  Change in appetite 0 0 0 0 0  Feeling bad or failure about yourself  0 0 0 0 0  Trouble concentrating 0 0 0 0 0  Moving slowly or fidgety/restless 0 0 0 0 0  Suicidal thoughts 0 0 0 0 0  PHQ-9 Score 0 0 0 0 0  Difficult doing work/chores - Not difficult at all Not difficult at all Not difficult at all Not difficult at all    phq 9 is negative   Fall Risk: Fall Risk  02/17/2020 11/15/2019 07/19/2019 07/12/2019 07/01/2019  Falls in the past year? 0 0 0 0 0  Number falls in past yr: 0 0 0 0 -  Injury with Fall? 0 0 0 0 -  Follow up - Falls evaluation completed Falls evaluation completed - -    Assessment & Plan  1. Obesity (BMI 35.0-39.9 without comorbidity)  - Naltrexone-buPROPion HCl ER (CONTRAVE) 8-90 MG TB12; Take 2 tablets by mouth in the morning and at bedtime.  Dispense: 120 tablet; Refill: 0  Discussed possible side effects and risk and benefits of medication  2 Need for Tdap vaccination  - Tdap vaccine greater than or equal to 7yo IM   3. Elevated blood pressure reading  Explained bp may go up on contrave

## 2020-02-17 ENCOUNTER — Ambulatory Visit: Payer: Commercial Managed Care - PPO | Admitting: Family Medicine

## 2020-02-17 ENCOUNTER — Other Ambulatory Visit: Payer: Self-pay

## 2020-02-17 ENCOUNTER — Encounter: Payer: Self-pay | Admitting: Family Medicine

## 2020-02-17 VITALS — BP 140/84 | HR 82 | Temp 98.6°F | Resp 16 | Ht 67.0 in | Wt 243.4 lb

## 2020-02-17 DIAGNOSIS — Z23 Encounter for immunization: Secondary | ICD-10-CM

## 2020-02-17 DIAGNOSIS — E669 Obesity, unspecified: Secondary | ICD-10-CM

## 2020-02-17 DIAGNOSIS — R030 Elevated blood-pressure reading, without diagnosis of hypertension: Secondary | ICD-10-CM | POA: Diagnosis not present

## 2020-02-17 MED ORDER — CONTRAVE 8-90 MG PO TB12: 2.0000 | ORAL_TABLET | Freq: Two times a day (BID) | ORAL | 0 refills | Status: DC

## 2020-02-29 ENCOUNTER — Telehealth: Payer: Self-pay | Admitting: Family Medicine

## 2020-02-29 NOTE — Telephone Encounter (Signed)
Copied from CRM [id number]. Topic: Quick Communication - Rx Refill/Question >> Feb 20, 2020 11:00 AM Randol Kern wrote: Pt called in to report that her Rx is not covered by her insurance, Naltrexone-buPROPion HCl ER (CONTRAVE) 8-90 MG TB12   Pt is requesting alternative Rx that she can afford please advise  [facility] [address] ([address]), [place] - [address] ([address]) [place] [address] Phone: [phone] Fax: [phone]

## 2020-02-29 NOTE — Telephone Encounter (Signed)
Pt calling back this am because she never heard back about this med Naltrexone-buPROPion HCl ER (CONTRAVE) 8-90 MG TB12 That is not covered under her insurance.  Pt would like to hear back, she is very frustrated over this.

## 2020-03-02 ENCOUNTER — Telehealth: Payer: Self-pay | Admitting: Family Medicine

## 2020-03-02 NOTE — Telephone Encounter (Signed)
Patient is calling to request a lab appt by her surgeon.  Patient was told that she needs to submit lab by Monday or Tuesday of next week. Spoke to [place],  Was advised the first available appt was 03/20/20 at 10:40a. For Presurgical clearance and labs, Patient declined and stated that she need appt by Monday or Tuesday of next week. Patient would like a call back from [place] in hopes to receive a sooner appt. Please advise Cb- [phone]

## 2020-03-02 NOTE — Telephone Encounter (Signed)
Did you find her a appointment

## 2020-03-02 NOTE — Telephone Encounter (Signed)
Patient is requesting a lab and presurgical clearance appt. Patient was offered appt on 03/20/20 pers

## 2020-03-15 ENCOUNTER — Telehealth: Payer: Self-pay

## 2020-03-15 NOTE — Telephone Encounter (Signed)
Please call pt and find out exactly what she is needing?

## 2020-03-15 NOTE — Telephone Encounter (Signed)
Copied from CRM [id number]. Topic: General - Other >> Mar 14, 2020  3:28 PM Marylen Ponto wrote: Reason for CRM: Pt stated she needs to have an EKG and blood pressure check done by Friday for a procedure she has scheduled. Pt requests call back. Cb# [phone] >> Mar 14, 2020  3:47 PM Myatt, Barbee Cough wrote: Called pt to see what she needed and she states she needs a EKG and BP  Check before Friday for a procedure in Nov. Pt would like a call back. I explained to pt this may not be able to happen. Pt would like a call back

## 2020-03-16 NOTE — Telephone Encounter (Signed)
Pt has appt on 10/8 will discuss then

## 2020-03-29 NOTE — Progress Notes (Deleted)
   Name: Shannon Escobar   MRN: [id number]    DOB: 06/05/1976   Date:03/29/2020       Progress Note  No chief complaint on file.    Subjective:   Shannon Escobar is a 44 y.o. female, presents to clinic for   ***   Current Outpatient Medications:  .  Naltrexone-buPROPion HCl ER (CONTRAVE) 8-90 MG TB12, Take 2 tablets by mouth in the morning and at bedtime., Disp: 120 tablet, Rfl: 0  Patient Active Problem List   Diagnosis Date Noted  . Menorrhagia with regular cycle 04/08/2019  . Prediabetes 04/08/2019  . Class 2 severe obesity due to excess calories with serious comorbidity and body mass index (BMI) of 38.0 to 38.9 in adult (HCC) 04/08/2019  . Iron deficiency anemia 10/12/2018  . DJD (degenerative joint disease), lumbosacral 08/05/2016  . Annular tear of lumbar disc 08/05/2016  . Hidradenitis suppurativa 08/05/2016    Past Surgical History:  Procedure Laterality Date  . ABDOMINAL SURGERY    . BREAST CYST ASPIRATION Right 2011   abcess drained  . CESAREAN SECTION     x2  . CHOLECYSTECTOMY    . TUBAL LIGATION      Family History  Problem Relation Age of Onset  . Diabetes Mother     Social History   Tobacco Use  . Smoking status: Never Smoker  . Smokeless tobacco: Never Used  Vaping Use  . Vaping Use: Never used  Substance Use Topics  . Alcohol use: No    Comment: occassional in social  . Drug use: Never     Allergies  Allergen Reactions  . Penicillins     Health Maintenance  Topic Date Due  . COVID-19 Vaccine (1) Never done  . INFLUENZA VACCINE  Never done  . PAP SMEAR-Modifier  08/12/2022  . TETANUS/TDAP  02/16/2030  . Hepatitis C Screening  Completed  . HIV Screening  Completed    Chart Review Today: ***  Review of Systems   Objective:   There were no vitals filed for this visit.  There is no height or weight on file to calculate BMI.  Physical Exam      Assessment & Plan:   ***  No follow-ups on file.   Marcos Eke,  CMA 03/29/20 4:05 PM

## 2020-03-30 ENCOUNTER — Ambulatory Visit: Payer: Commercial Managed Care - PPO | Admitting: Family Medicine

## 2020-04-20 ENCOUNTER — Ambulatory Visit: Payer: Commercial Managed Care - PPO | Admitting: Internal Medicine

## 2020-08-14 ENCOUNTER — Ambulatory Visit: Payer: Commercial Managed Care - PPO | Admitting: Internal Medicine

## 2020-09-18 ENCOUNTER — Ambulatory Visit: Payer: Commercial Managed Care - PPO | Admitting: Internal Medicine

## 2020-12-10 ENCOUNTER — Ambulatory Visit: Payer: Commercial Managed Care - PPO | Admitting: Internal Medicine

## 2020-12-10 ENCOUNTER — Other Ambulatory Visit: Payer: Self-pay

## 2020-12-10 ENCOUNTER — Encounter: Payer: Self-pay | Admitting: Internal Medicine

## 2020-12-10 VITALS — BP 129/86 | HR 75 | Temp 97.7°F | Resp 17 | Ht 68.0 in | Wt 246.2 lb

## 2020-12-10 DIAGNOSIS — E6609 Other obesity due to excess calories: Secondary | ICD-10-CM | POA: Insufficient documentation

## 2020-12-10 DIAGNOSIS — D500 Iron deficiency anemia secondary to blood loss (chronic): Secondary | ICD-10-CM | POA: Diagnosis not present

## 2020-12-10 DIAGNOSIS — R7303 Prediabetes: Secondary | ICD-10-CM | POA: Diagnosis not present

## 2020-12-10 DIAGNOSIS — Z6837 Body mass index (BMI) 37.0-37.9, adult: Secondary | ICD-10-CM

## 2020-12-10 DIAGNOSIS — E782 Mixed hyperlipidemia: Secondary | ICD-10-CM

## 2020-12-10 DIAGNOSIS — E66812 Obesity, class 2: Secondary | ICD-10-CM

## 2020-12-10 DIAGNOSIS — E66813 Obesity, class 3: Secondary | ICD-10-CM | POA: Insufficient documentation

## 2020-12-10 NOTE — Progress Notes (Signed)
HPI  Patient presents the clinic today to establish care and for management of the conditions listed below.  She is transferring care from [facility].  Iron deficiency Anemia: Secondary to heavy menstrual cycles, resolved with IUD placement.  Her last H/H was 16.2/48, 10/2019.  She has had IV iron infusions in the past but is not currently taking any oral iron at this time.  She no longer follows with hematology.  Prediabetes: Her last A1c was 5.5%, 11/2019.  She is not taking any oral diabetic medication at this time.  She does not check her sugars.  She would like to start some weight loss medication.  Her BP today is 246 pounds with a BMI of 37.43.  She has failed Contrave in the past.  Breakfast: sausage egg and cheese biscuit, hashbrown Lunch: skip Dinner: fast food, zaxbys Snacks: ice cream Exercise: walking  Past Medical History:  Diagnosis Date   Anemia    Diabetes mellitus without complication (HCC)    had gestational diabetes    Current Outpatient Medications  Medication Sig Dispense Refill   Naltrexone-buPROPion HCl ER (CONTRAVE) 8-90 MG TB12 Take 2 tablets by mouth in the morning and at bedtime. 120 tablet 0   No current facility-administered medications for this visit.    Allergies  Allergen Reactions   Penicillins     Family History  Problem Relation Age of Onset   Diabetes Mother     Social History   Socioeconomic History   Marital status: Single    Spouse name: Not on file   Number of children: 2   Years of education: Not on file   Highest education level: Not on file  Occupational History   Not on file  Tobacco Use   Smoking status: Never   Smokeless tobacco: Never  Vaping Use   Vaping Use: Never used  Substance and Sexual Activity   Alcohol use: No    Comment: occassional in social   Drug use: Never   Sexual activity: Yes    Partners: Male    Birth control/protection: I.U.D.  Other Topics Concern   Not on file  Social  History Narrative   Not on file   Social Determinants of Health   Financial Resource Strain: Not on file  Food Insecurity: Not on file  Transportation Needs: Not on file  Physical Activity: Not on file  Stress: Not on file  Social Connections: Not on file  Intimate Partner Violence: Not on file    ROS:  Constitutional: Patient reports abnormal weight gain.  Denies fever, malaise, fatigue, headache.  HEENT: Denies eye pain, eye redness, ear pain, ringing in the ears, wax buildup, runny nose, nasal congestion, bloody nose, or sore throat. Respiratory: Denies difficulty breathing, shortness of breath, cough or sputum production.   Cardiovascular: Denies chest pain, chest tightness, palpitations or swelling in the hands or feet.  Gastrointestinal: Denies abdominal pain, bloating, constipation, diarrhea or blood in the stool.  GU: Denies frequency, urgency, pain with urination, blood in urine, odor or discharge. Musculoskeletal: Denies decrease in range of motion, difficulty with gait, muscle pain or joint pain and swelling.  Skin: Denies redness, rashes, lesions or ulcercations.  Neurological: Denies dizziness, difficulty with memory, difficulty with speech or problems with balance and coordination.  Psych: Denies anxiety, depression, SI/HI.  No other specific complaints in a complete review of systems (except as listed in HPI above).  PE: BP 129/86 (BP Location: Right Arm, Patient Position: Sitting, Cuff Size: Large)  Pulse 75   Temp 97.7 F (36.5 C) (Temporal)   Resp 17   Ht 5\' 8"  (1.727 m)   Wt 246 lb 3.2 oz (111.7 kg)   SpO2 100%   BMI 37.43 kg/m   Wt Readings from Last 3 Encounters:  02/17/20 243 lb 6.4 oz (110.4 kg)  02/20/20 239 lb (108.4 kg)  11/15/19 239 lb 1.6 oz (108.5 kg)    General: Appears her stated age, obese, in NAD. Skin: No rash or ulceration noted. HEENT: Head: normal shape and size; Eyes: sclera white and EOMs intact;  Neck: Neck supple, trachea  midline. No masses, lumps present.  Thyromegaly noted. Cardiovascular: Normal rate and rhythm. S1,S2 noted.  No murmur, rubs or gallops noted. No JVD or BLE edema.  Pulmonary/Chest: Normal effort and positive vesicular breath sounds. No respiratory distress. No wheezes, rales or ronchi noted.  Musculoskeletal: No difficulty with gait.  Neurological: Alert and oriented.  Psychiatric: Mood and affect normal. Behavior is normal. Judgment and thought content normal.    BMET    Component Value Date/Time   NA 141 11/15/2019 1107   K 4.1 11/15/2019 1107   CL 103 11/15/2019 1107   CO2 30 11/15/2019 1107   GLUCOSE 97 11/15/2019 1107   BUN 7 11/15/2019 1107   CREATININE 0.79 11/15/2019 1107   CALCIUM 9.8 11/15/2019 1107   GFRNONAA 106 07/19/2019 0846   GFRAA 123 07/19/2019 0846    Lipid Panel     Component Value Date/Time   CHOL 159 04/08/2019 0000   TRIG 84 04/08/2019 0000   HDL 44 (L) 04/08/2019 0000   CHOLHDL 3.6 04/08/2019 0000   LDLCALC 98 04/08/2019 0000    CBC    Component Value Date/Time   WBC 6.3 11/15/2019 1107   RBC 5.29 (H) 11/15/2019 1107   HGB 16.2 (H) 11/15/2019 1107   HCT 48.0 (H) 11/15/2019 1107   PLT 282 11/15/2019 1107   MCV 90.7 11/15/2019 1107   MCH 30.6 11/15/2019 1107   MCHC 33.8 11/15/2019 1107   RDW 13.2 11/15/2019 1107   LYMPHSABS 1,115 11/15/2019 1107   MONOABS 0.4 10/25/2019 1059   EOSABS 120 11/15/2019 1107   BASOSABS 38 11/15/2019 1107    Hgb A1C Lab Results  Component Value Date   HGBA1C 5.5 11/23/2019     Assessment and Plan:    01/23/2020, NP This visit occurred during the SARS-CoV-2 public health emergency.  Safety protocols were in place, including screening questions prior to the visit, additional usage of staff PPE, and extensive cleaning of exam room while observing appropriate contact time as indicated for disinfecting solutions.

## 2020-12-10 NOTE — Assessment & Plan Note (Signed)
CBC reviewed No intervention at this time

## 2020-12-10 NOTE — Patient Instructions (Signed)

## 2020-12-10 NOTE — Assessment & Plan Note (Addendum)
We will check c-Met, lipid, TSH and A1c today Encourage low-carb, calorie restricted diet and exercise weight loss Consider Saxenda/ozempic for weight management

## 2020-12-10 NOTE — Assessment & Plan Note (Signed)
We will check c-Met, lipid and A1c today Encouraged her to consume a low-carb diet and exercise for weight loss Consider Saxenda for weight management 

## 2020-12-11 DIAGNOSIS — E782 Mixed hyperlipidemia: Secondary | ICD-10-CM | POA: Insufficient documentation

## 2020-12-11 DIAGNOSIS — E1169 Type 2 diabetes mellitus with other specified complication: Secondary | ICD-10-CM | POA: Insufficient documentation

## 2020-12-11 LAB — COMPREHENSIVE METABOLIC PANEL
AG Ratio: 1.1 (calc) (ref 1.0–2.5)
ALT: 22 U/L (ref 6–29)
AST: 17 U/L (ref 10–35)
Albumin: 4.3 g/dL (ref 3.6–5.1)
Alkaline phosphatase (APISO): 113 U/L (ref 31–125)
BUN: 13 mg/dL (ref 7–25)
CO2: 28 mmol/L (ref 20–32)
Calcium: 9.7 mg/dL (ref 8.6–10.2)
Chloride: 101 mmol/L (ref 98–110)
Creat: 0.83 mg/dL (ref 0.50–1.10)
Globulin: 3.8 g/dL (calc) — ABNORMAL HIGH (ref 1.9–3.7)
Glucose, Bld: 196 mg/dL — ABNORMAL HIGH (ref 65–139)
Potassium: 4 mmol/L (ref 3.5–5.3)
Sodium: 139 mmol/L (ref 135–146)
Total Bilirubin: 0.5 mg/dL (ref 0.2–1.2)
Total Protein: 8.1 g/dL (ref 6.1–8.1)

## 2020-12-11 LAB — HEMOGLOBIN A1C
Hgb A1c MFr Bld: 6 % of total Hgb — ABNORMAL HIGH (ref <5.7)
Mean Plasma Glucose: 126 mg/dL
eAG (mmol/L): 7 mmol/L

## 2020-12-11 LAB — LIPID PANEL
Cholesterol: 212 mg/dL — ABNORMAL HIGH (ref <200)
HDL: 54 mg/dL (ref >=50)
LDL Cholesterol (Calc): 127 mg/dL (calc) — ABNORMAL HIGH
Non-HDL Cholesterol (Calc): 158 mg/dL (calc) — ABNORMAL HIGH (ref <130)
Total CHOL/HDL Ratio: 3.9 (calc) (ref <5.0)
Triglycerides: 185 mg/dL — ABNORMAL HIGH (ref <150)

## 2020-12-11 LAB — TSH: TSH: 1.27 mIU/L

## 2020-12-11 NOTE — Addendum Note (Signed)
Addended by: Lorre Munroe on: 12/11/2020 09:05 AM   Modules accepted: Orders

## 2020-12-13 MED ORDER — OZEMPIC (0.25 OR 0.5 MG/DOSE) 2 MG/1.5ML ~~LOC~~ SOPN: 0.2500 mg | PEN_INJECTOR | SUBCUTANEOUS | 0 refills | Status: DC

## 2020-12-13 MED ORDER — INSULIN PEN NEEDLE 31G X 5 MM MISC: 0 refills | Status: DC

## 2020-12-13 NOTE — Addendum Note (Signed)
Addended by: Lorre Munroe on: 12/13/2020 07:19 AM   Modules accepted: Orders

## 2021-01-14 ENCOUNTER — Other Ambulatory Visit: Payer: Self-pay | Admitting: Internal Medicine

## 2021-01-14 NOTE — Telephone Encounter (Signed)
Requested Prescriptions  Pending Prescriptions Disp Refills  . OZEMPIC, 0.25 OR 0.5 MG/DOSE, 2 MG/1.5ML SOPN [Pharmacy Med Name: Ozempic (0.25 or 0.5 MG/DOSE) 2 MG/1.5ML Subcutaneous Solution Pen-injector] 2 mL 0    Sig: INJECT 0.25MG  SUBCUTANEOUSLY ONCE A WEEK FOR THE FIRST 4 WEEKS, THEN INCREASE TO 0.5MG  ONCE A WEEK.     Endocrinology:  Diabetes - GLP-1 Receptor Agonists Passed - 01/14/2021  9:25 PM      Passed - HBA1C is between 0 and 7.9 and within 180 days    Hgb A1c MFr Bld  Date Value Ref Range Status  12/10/2020 6.0 (H) <5.7 % of total Hgb Final    Comment:    For someone without known diabetes, a hemoglobin  A1c value between 5.7% and 6.4% is consistent with prediabetes and should be confirmed with a  follow-up test. . For someone with known diabetes, a value <7% indicates that their diabetes is well controlled. A1c targets should be individualized based on duration of diabetes, age, comorbid conditions, and other considerations. . This assay result is consistent with an increased risk of diabetes. . Currently, no consensus exists regarding use of hemoglobin A1c for diagnosis of diabetes for children. Verna Czech - Valid encounter within last 6 months    Recent Outpatient Visits          1 month ago Prediabetes   [facility] [place], [place], NP   11 months ago Obesity (BMI 35.0-39.9 without comorbidity)   [facility] [place] Cory, MD   1 year ago Obesity (BMI 35.0-39.9 without comorbidity)   [facility] Alba Cory, MD   1 year ago Preoperative clearance   [facility] Doren Custard, FNP   1 year ago Prediabetes   [facility] Doren Custard, FNP      Future Appointments            In 4 weeks Deirdre Evener, MD [facility]

## 2021-02-11 ENCOUNTER — Ambulatory Visit: Payer: Commercial Managed Care - PPO | Admitting: Dermatology

## 2021-05-04 IMAGING — CR DG CHEST 2V
1 series · 2 of 2 positions shown · non-contrast
Comparison: No recent.

CLINICAL DATA: Screening for surgical clearance.

EXAM:
CHEST - 2 VIEW

[Series 1: dg chest 2 view · 0.14mm/px · 2 of 2 slices shown]
[im 1/2]
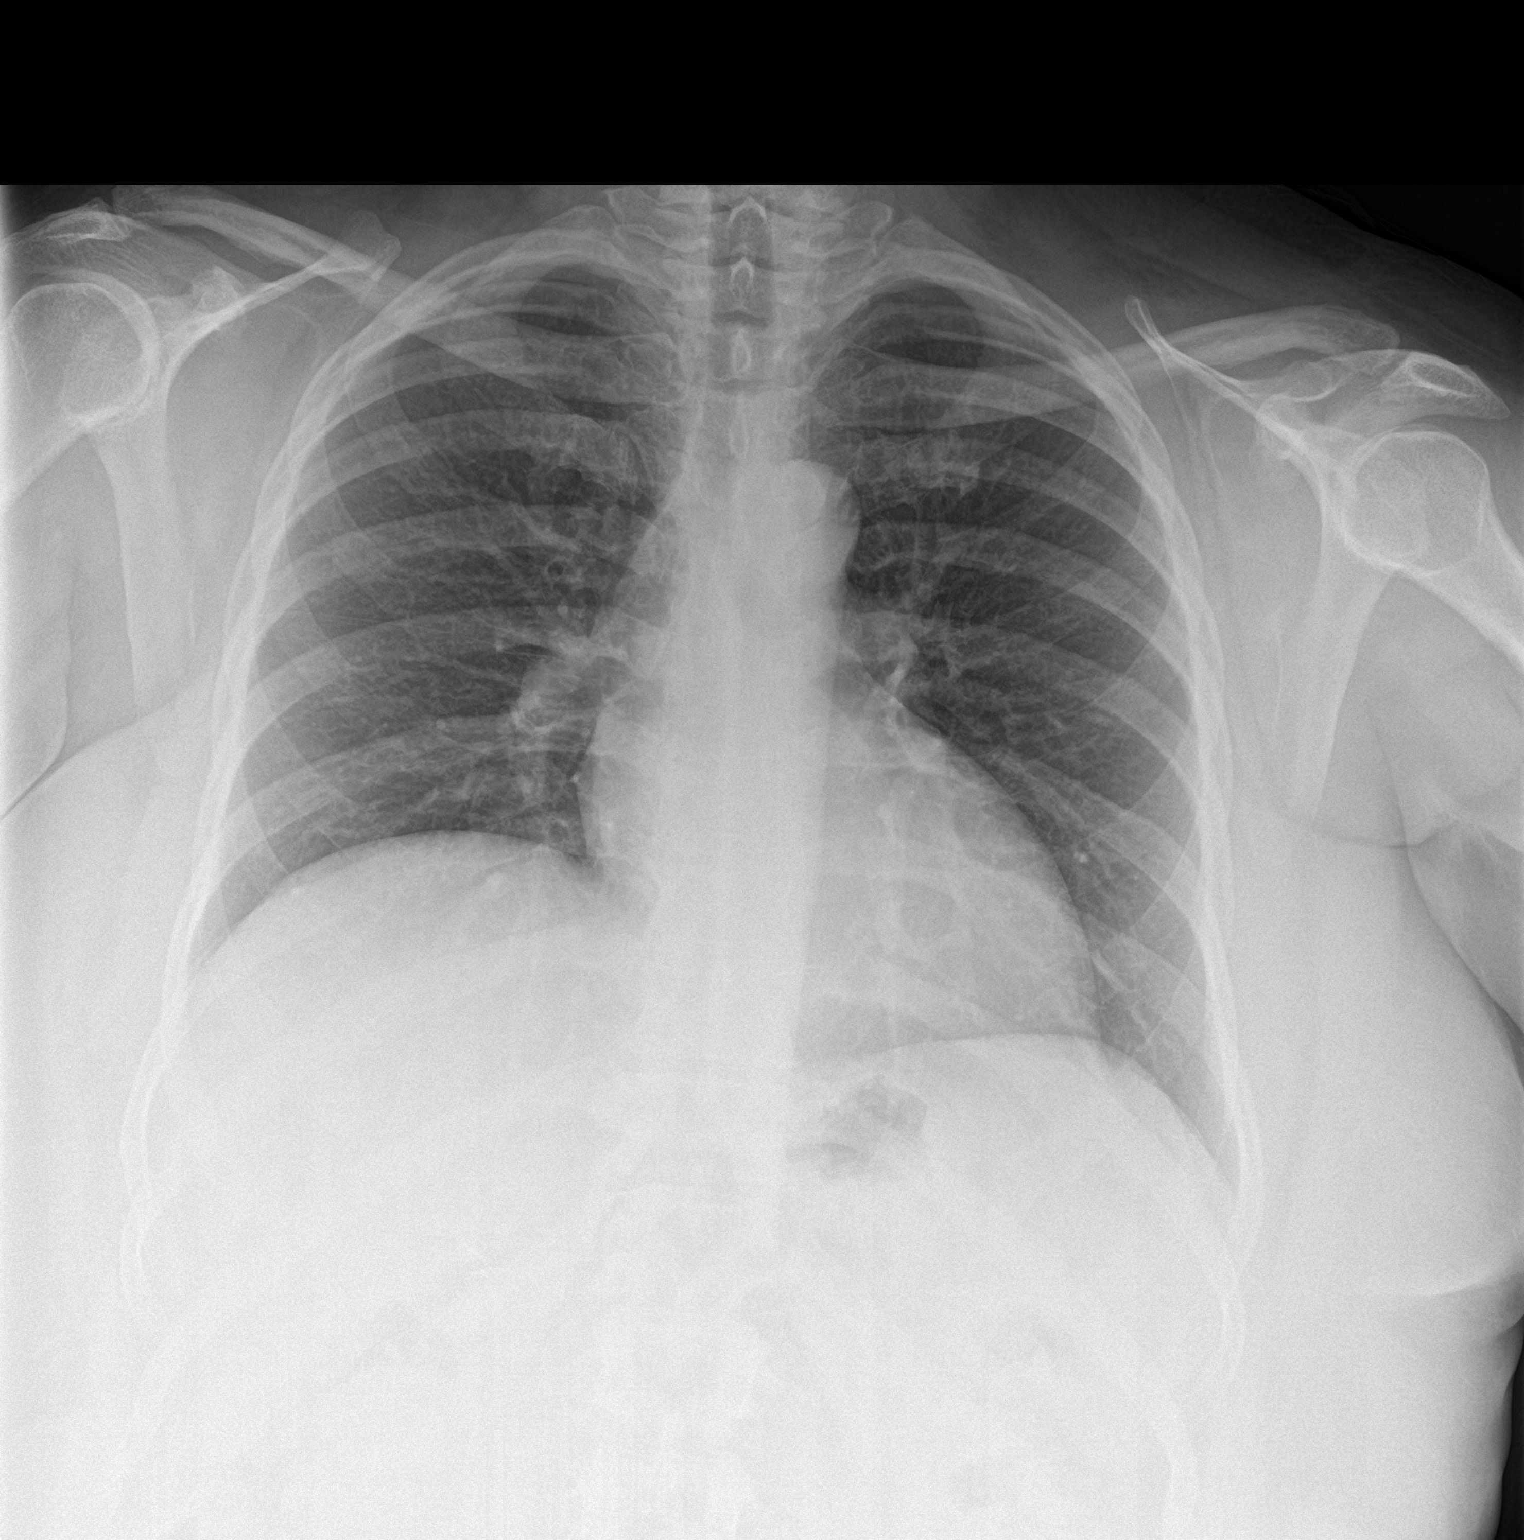
[im 2/2]
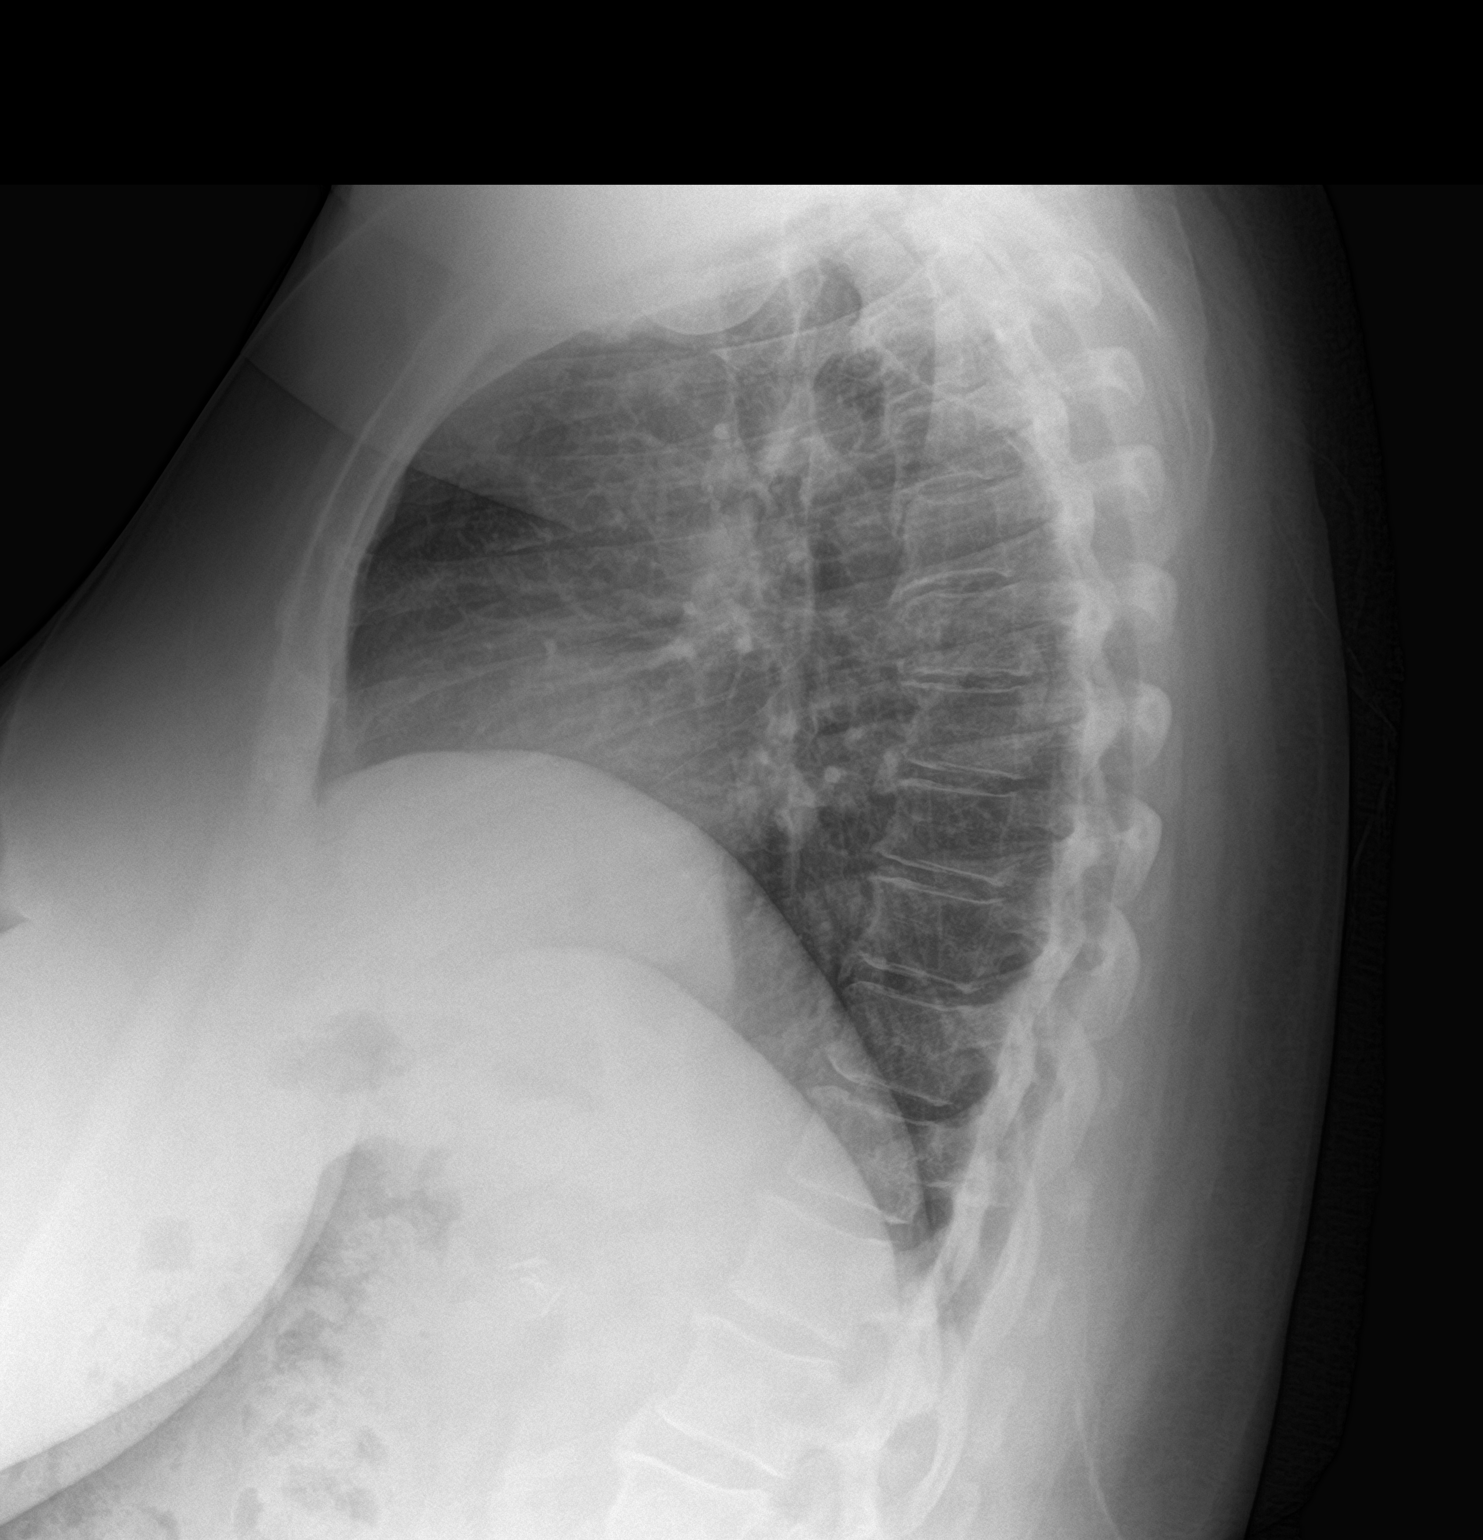

[2 of 2 positions shown; findings below may reference images not displayed]

FINDINGS: Mediastinum and hilar structures normal. Lungs are clear. No pleural
effusion or pneumothorax. Degenerative change thoracic spine.
Surgical clips right upper quadrant.
IMPRESSION: No acute cardiopulmonary disease.

## 2021-07-01 ENCOUNTER — Other Ambulatory Visit: Payer: Self-pay | Admitting: Internal Medicine

## 2021-07-02 NOTE — Telephone Encounter (Signed)
Requested medication (s) are due for refill today: yes  Requested medication (s) are on the active medication list: yes  Last refill:  01/15/21  Future visit scheduled: no  Notes to clinic:  canceled 04/20/21, no upcoming appt. Please assess.   Requested Prescriptions  Pending Prescriptions Disp Refills   OZEMPIC, 0.25 OR 0.5 MG/DOSE, 2 MG/1.5ML SOPN [Pharmacy Med Name: Ozempic (0.25 or 0.5 MG/DOSE) 2 MG/1.5ML Subcutaneous Solution Pen-injector] 2 mL 0    Sig: INJECT 0.25 MG SUBCUTANEOUSLY ONCE A WEEK FOR THE FIRST 4 WEEKS, THEN INCREASE TO 0.5MG  ONCE A WEEK     Endocrinology:  Diabetes - GLP-1 Receptor Agonists Failed - 07/01/2021  7:28 PM      Failed - HBA1C is between 0 and 7.9 and within 180 days    Hgb A1c MFr Bld  Date Value Ref Range Status  12/10/2020 6.0 (H) <5.7 % of total Hgb Final    Comment:    For someone without known diabetes, a hemoglobin  A1c value between 5.7% and 6.4% is consistent with prediabetes and should be confirmed with a  follow-up test. . For someone with known diabetes, a value <7% indicates that their diabetes is well controlled. A1c targets should be individualized based on duration of diabetes, age, comorbid conditions, and other considerations. . This assay result is consistent with an increased risk of diabetes. . Currently, no consensus exists regarding use of hemoglobin A1c for diagnosis of diabetes for children. .           Failed - Valid encounter within last 6 months    Recent Outpatient Visits           6 months ago Prediabetes   [facility] [place], [place], NP   1 year ago Obesity (BMI 35.0-39.9 without comorbidity)   [facility] Alba Cory, MD   1 year ago Obesity (BMI 35.0-39.9 without comorbidity)   [facility] Alba Cory, MD   1 year ago Preoperative clearance   [facility] Doren Custard, FNP   1 year ago Prediabetes   [facility] Doren Custard, [place]

## 2021-08-31 IMAGING — CR DG CHEST 2V
1 series · 2 of 2 positions shown · non-contrast
Comparison: July 19, 2019.

CLINICAL DATA: Preoperative assessment

EXAM:
CHEST - 2 VIEW

[Series 1: dg chest 2 view · 0.14mm/px · 2 of 2 slices shown]
[im 1/2]
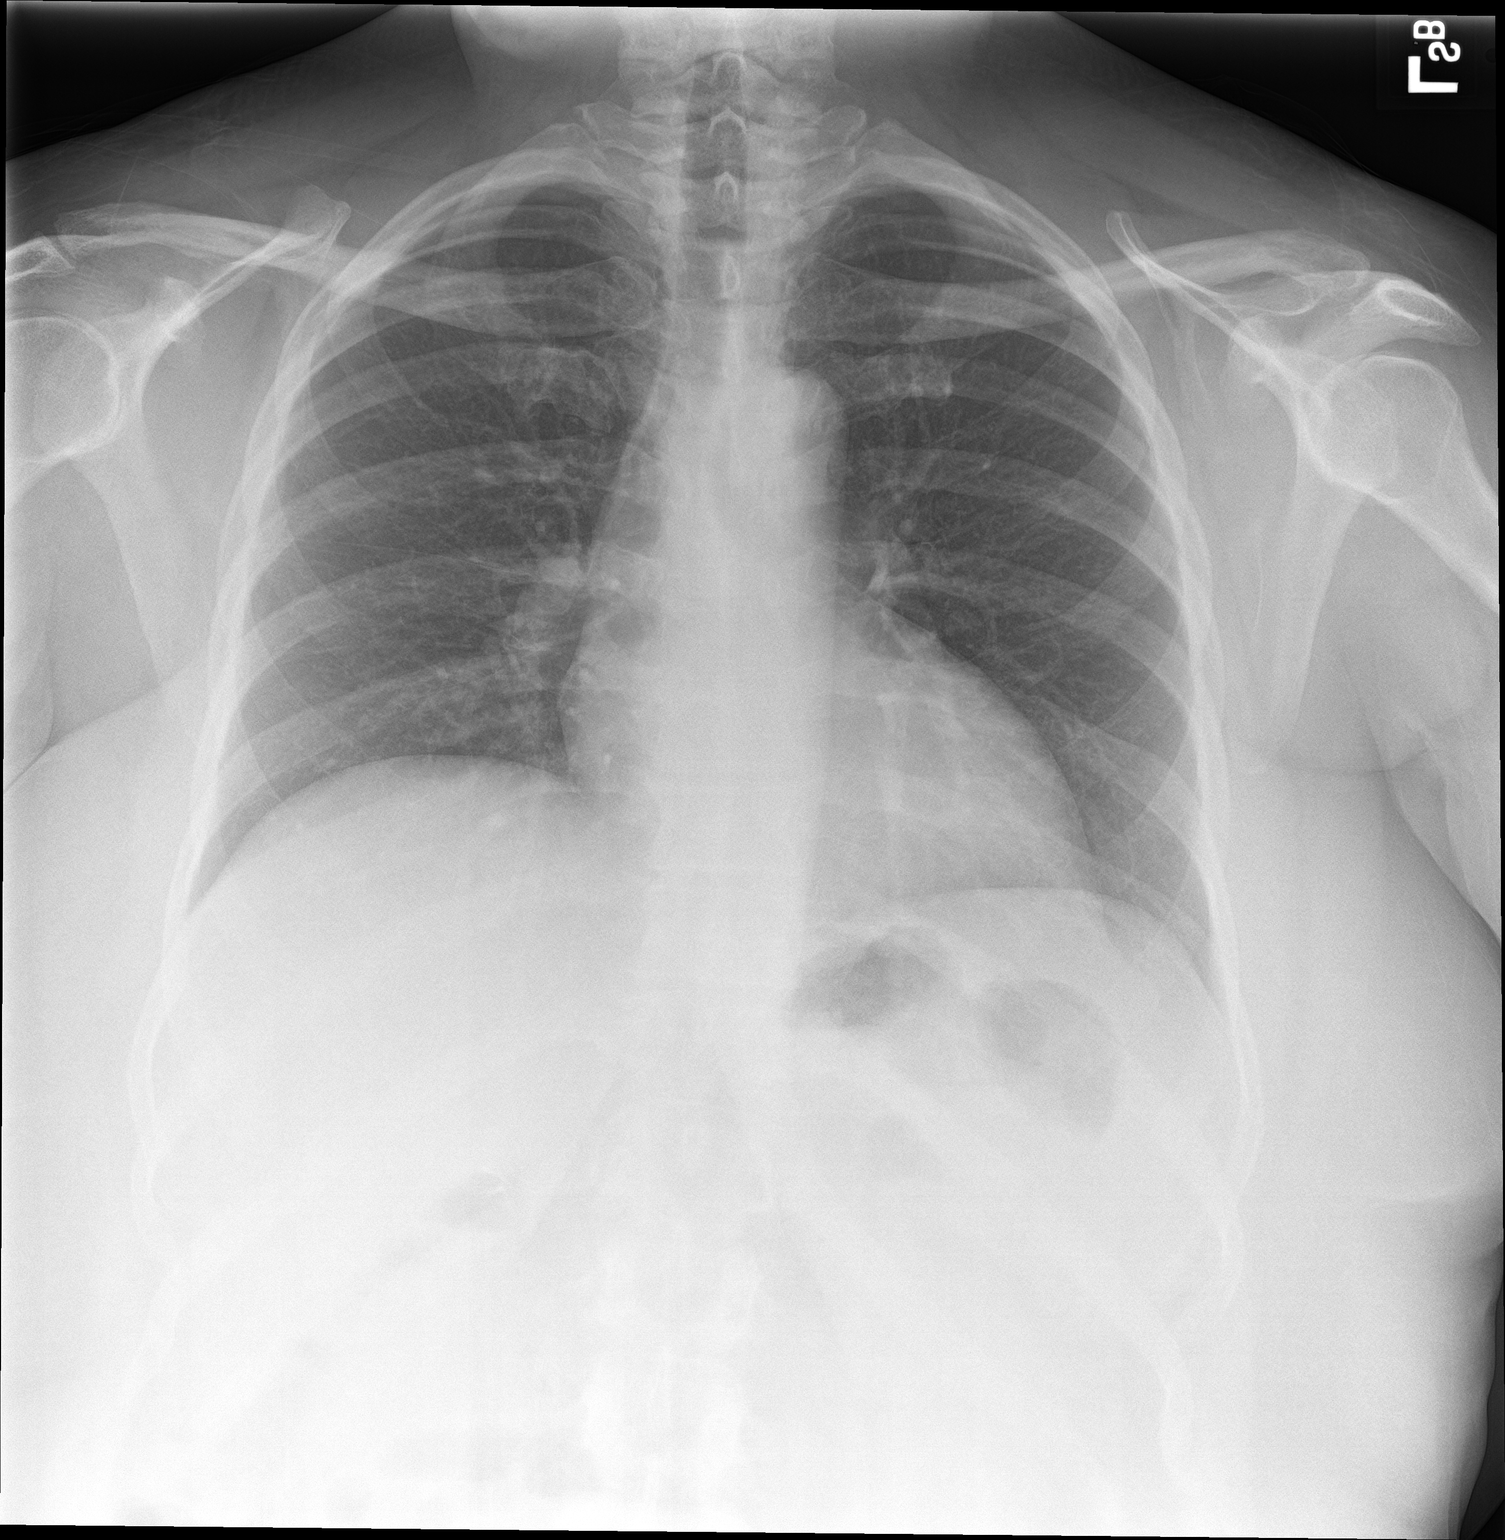
[im 2/2]
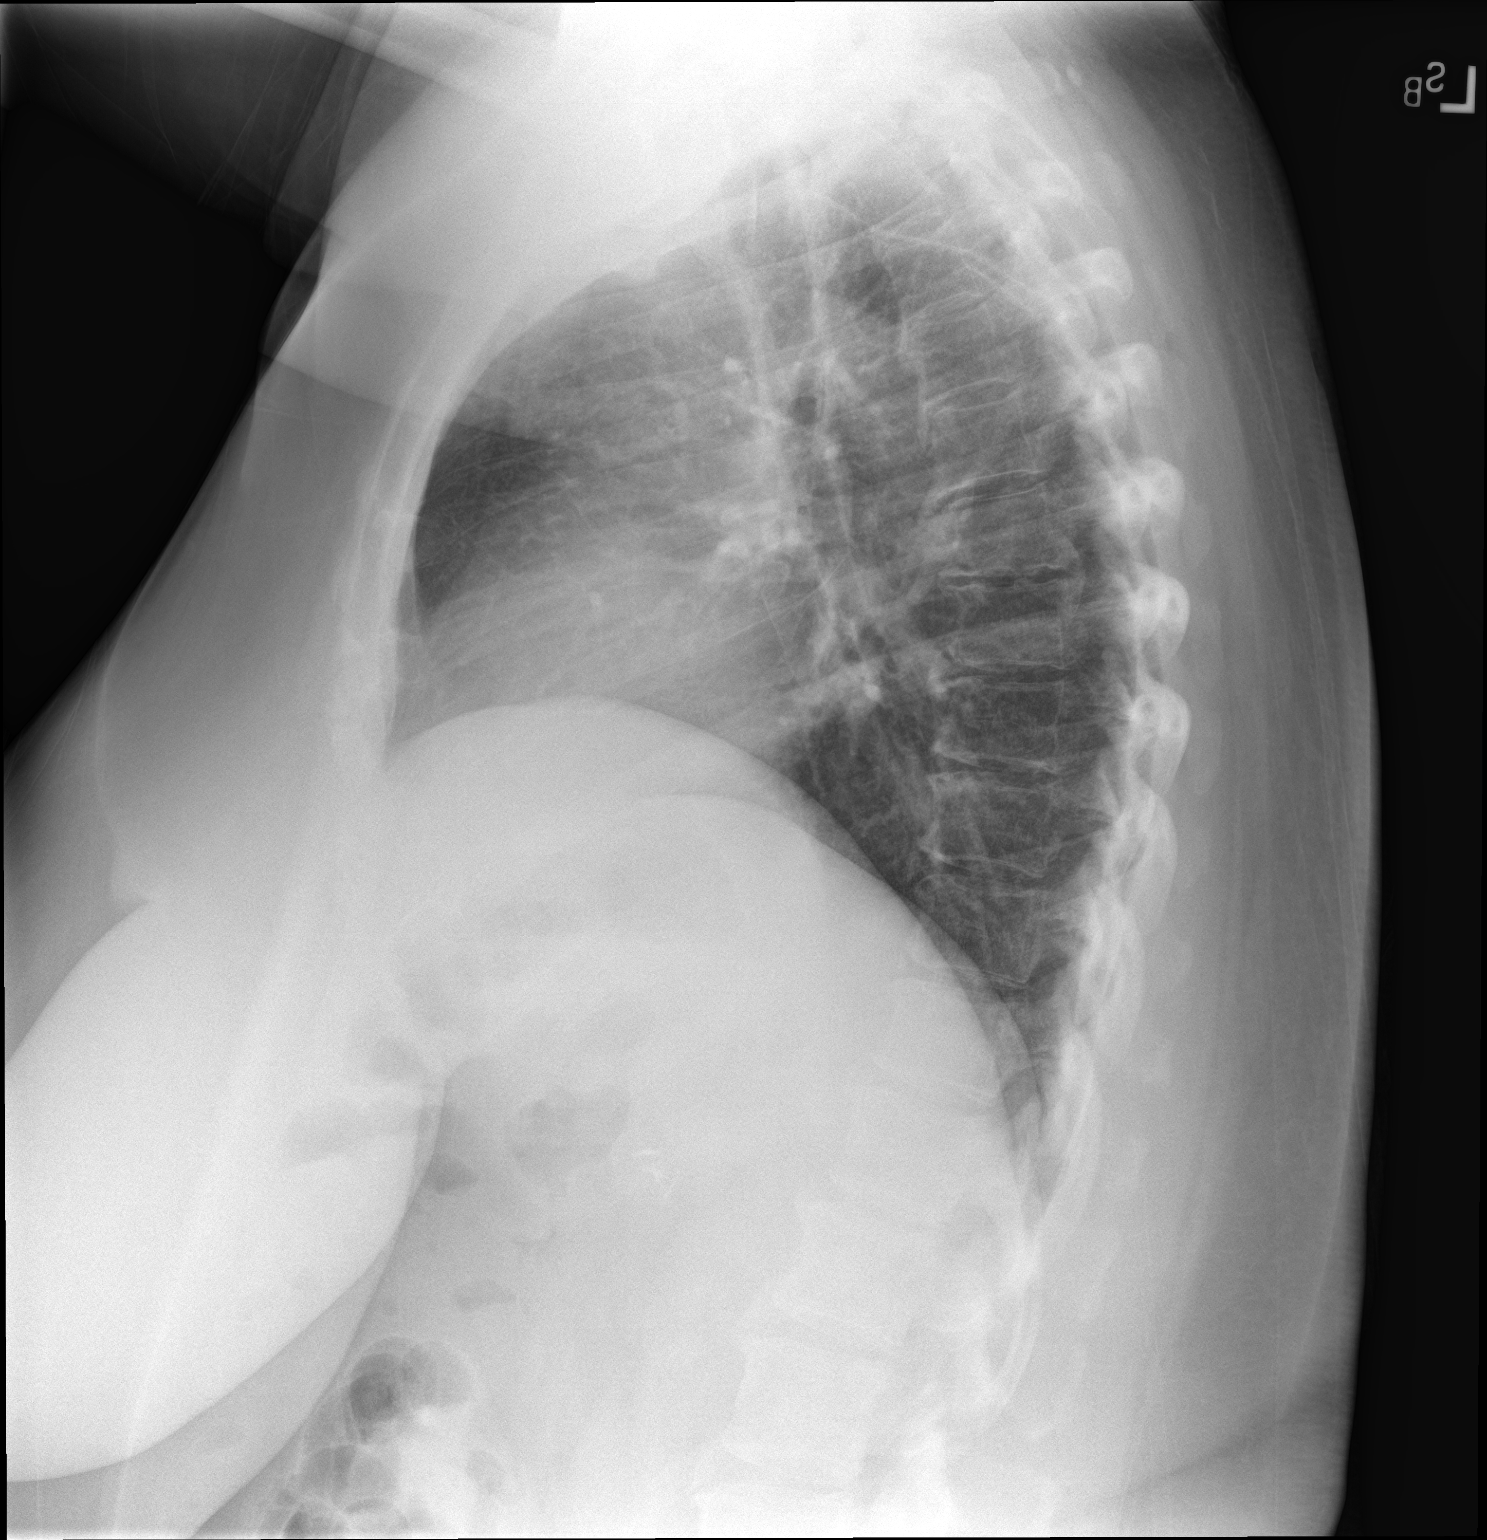

[2 of 2 positions shown; findings below may reference images not displayed]

FINDINGS: Lungs are clear. Heart size and pulmonary vascularity are normal. No
adenopathy. No bone lesions. Surgical clips are noted in the right
upper abdomen.
IMPRESSION: Lungs clear.  Cardiac silhouette within normal limits.

## 2021-10-22 ENCOUNTER — Ambulatory Visit: Payer: Commercial Managed Care - PPO | Admitting: Internal Medicine

## 2021-10-22 ENCOUNTER — Ambulatory Visit: Payer: Self-pay | Admitting: *Deleted

## 2021-10-22 ENCOUNTER — Other Ambulatory Visit: Payer: Self-pay | Admitting: Internal Medicine

## 2021-10-22 ENCOUNTER — Encounter: Payer: Self-pay | Admitting: Internal Medicine

## 2021-10-22 VITALS — BP 186/102 | HR 65 | Temp 97.1°F | Wt 254.0 lb

## 2021-10-22 DIAGNOSIS — Z6838 Body mass index (BMI) 38.0-38.9, adult: Secondary | ICD-10-CM

## 2021-10-22 DIAGNOSIS — R7303 Prediabetes: Secondary | ICD-10-CM | POA: Diagnosis not present

## 2021-10-22 DIAGNOSIS — I10 Essential (primary) hypertension: Secondary | ICD-10-CM | POA: Diagnosis not present

## 2021-10-22 DIAGNOSIS — D500 Iron deficiency anemia secondary to blood loss (chronic): Secondary | ICD-10-CM

## 2021-10-22 DIAGNOSIS — E782 Mixed hyperlipidemia: Secondary | ICD-10-CM

## 2021-10-22 MED ORDER — AMLODIPINE-OLMESARTAN 5-20 MG PO TABS: 1.0000 | ORAL_TABLET | Freq: Every day | ORAL | 0 refills | Status: DC

## 2021-10-22 NOTE — Patient Instructions (Signed)

## 2021-10-22 NOTE — Telephone Encounter (Signed)
?  Chief Complaint: Headache since Sunday    BP elevated.    ?Symptoms: blurry vision and throbbing headache since Sunday ?Frequency: Started Sunday.  ?Pertinent Negatives: Patient denies dizziness or having high BP ?Disposition: [] ED /[] Urgent Care (no appt availability in office) / [x] Appointment(In office/virtual)/ []  [place] [facility]/ [] Home Care/ [] Refused Recommended Disposition /[] [facility]/ []  Follow-up with PCP ?Additional Notes: Appt made for today at 11:00 with Webb Silversmith, NP  ?

## 2021-10-22 NOTE — Telephone Encounter (Signed)
Can you call patient and see what dose she was at.  It is is been greater than 1 month she will need to restart at the lowest dose ?

## 2021-10-22 NOTE — Assessment & Plan Note (Signed)
C-Met and lipid profile today Encouraged her to consume low-fat diet 

## 2021-10-22 NOTE — Telephone Encounter (Signed)
Reason for Disposition ? [1] MODERATE headache (e.g., interferes with normal activities) AND [2] present > 24 hours AND [3] unexplained  (Exceptions: analgesics not tried, typical migraine, or headache part of viral illness) ? ?Answer Assessment - Initial Assessment Questions ?1. LOCATION: "Where does it hurt?"  ?    I've had a headache since Sunday.   143/105 now my BP    155/107 ?2. ONSET: "When did the headache start?" (Minutes, hours or days)  ?    Sunday ?3. PATTERN: "Does the pain come and go, or has it been constant since it started?" ?    Constant since Sunday  ?4. SEVERITY: "How bad is the pain?" and "What does it keep you from doing?"  (e.g., Scale 1-10; mild, moderate, or severe) ?  - MILD (1-3): doesn't interfere with normal activities  ?  - MODERATE (4-7): interferes with normal activities or awakens from sleep  ?  - SEVERE (8-10): excruciating pain, unable to do any normal activities    ?    Severe ?5. RECURRENT SYMPTOM: "Have you ever had headaches before?" If Yes, ask: "When was the last time?" and "What happened that time?"  ?    I had elevated BP in the past but not on medications now.    ?6. CAUSE: "What do you think is causing the headache?" ?    My BP elevated   ?7. MIGRAINE: "Have you been diagnosed with migraine headaches?" If Yes, ask: "Is this headache similar?"  ?    Not asked ?8. HEAD INJURY: "Has there been any recent injury to the head?"  ?    *No Answer* ?9. OTHER SYMPTOMS: "Do you have any other symptoms?" (fever, stiff neck, eye pain, sore throat, cold symptoms) ?    For a couple of days having blurred vision.   ?10. PREGNANCY: "Is there any chance you are pregnant?" "When was your last menstrual period?" ?      *No Answer* ? ?Protocols used: Headache-A-AH ? ?

## 2021-10-22 NOTE — Assessment & Plan Note (Signed)
A1c today Encourage low-carb diet and exercise for weight loss 

## 2021-10-22 NOTE — Assessment & Plan Note (Signed)
Uncontrolled off meds ?We will start Amlodipine-Olmesartan 5-20 mg daily ?Reinforced DASH diet and exercise for weight loss ? ?RTC in 1 week for follow-up of HTN ?

## 2021-10-22 NOTE — Assessment & Plan Note (Signed)
Ozempic has been out of stock ?Encourage low-carb diet and exercise for weight loss ?

## 2021-10-22 NOTE — Telephone Encounter (Signed)
Requested medication (s) are due for refill today: yes ? ?Requested medication (s) are on the active medication list: yes ? ?Last refill:  01/14/21 #56mL/0 ? ?Future visit scheduled: yes, had OV today ? ?Notes to clinic:  Unable to refill per protocol due to failed labs, no updated results. ? ? ?  ?Requested Prescriptions  ?Pending Prescriptions Disp Refills  ? OZEMPIC, 0.25 OR 0.5 MG/DOSE, 2 MG/1.5ML SOPN [Pharmacy Med Name: Ozempic (0.25 or 0.5 MG/DOSE) 2 MG/1.5ML Subcutaneous Solution Pen-injector] 2 mL 0  ?  Sig: INJECT 0.25 MG SUBCUTANEOUSLY ONCE A WEEK FOR THE FIRST 4 WEEKS, THEN INCREASE TO 0.5MG  ONCE A WEEK  ?  ? Endocrinology:  Diabetes - GLP-1 Receptor Agonists - semaglutide Failed - 10/22/2021 11:19 AM  ?  ?  Failed - HBA1C in normal range and within 180 days  ?  Hgb A1c MFr Bld  ?Date Value Ref Range Status  ?12/10/2020 6.0 (H) <5.7 % of total Hgb Final  ?  Comment:  ?  For someone without known diabetes, a hemoglobin  ?A1c value between 5.7% and 6.4% is consistent with ?prediabetes and should be confirmed with a  ?follow-up test. ?. ?For someone with known diabetes, a value <7% ?indicates that their diabetes is well controlled. A1c ?targets should be individualized based on duration of ?diabetes, age, comorbid conditions, and other ?considerations. ?. ?This assay result is consistent with an increased risk ?of diabetes. ?. ?Currently, no consensus exists regarding use of ?hemoglobin A1c for diagnosis of diabetes for children. ?. ?  ?  ?  ?  ?  Passed - Cr in normal range and within 360 days  ?  Creat  ?Date Value Ref Range Status  ?12/10/2020 0.83 0.50 - 1.10 mg/dL Final  ?  ?  ?  ?  Passed - Valid encounter within last 6 months  ?  Recent Outpatient Visits   ? ?      ? Today Mixed hyperlipidemia  ? [facility] [place], [place], NP  ? 10 months ago Prediabetes  ? [facility] [place], [place], NP  ? 1 year ago Obesity (BMI 35.0-39.9 without comorbidity)  ? [facility] [place], Drue Stager, MD  ? 1 year ago Obesity (BMI 35.0-39.9 without comorbidity)  ? [facility] [place], Drue Stager, MD  ? 2 years ago Preoperative clearance  ? [place], [place]  ? ?  ?  ?Future Appointments   ? ?        ? In 6 days Baity, Coralie Keens, NP [facility] [place]  ? ?  ? ? ?  ?  ?  ? ?

## 2021-10-22 NOTE — Progress Notes (Signed)
? ?Subjective:  ? ? Patient ID: Shannon Escobar, female    DOB: June 11, 1976, 46 y.o.   MRN: [id number] ? ?HPI ? ?Patient presents to clinic today for follow-up of chronic conditions. ? ?Iron Deficiency Anemia/Polycythemia: Anemia resolved with IUD placement.  Her last H/H was 16.8/48, 5/21.  She is not taking any oral iron is no longer getting iron infusions.  She does not follow with hematology. ? ?Prediabetes: Her last A1c was 6%, 11/2018.  She is not taking any oral diabetic medication at this time.  She does not check her sugars. ? ?HLD: Her last LDL was 127, triglycerides 185, 11/2020.  She is not taking any oral cholesterol-lowering medication at this time.  She does not consume a low-fat diet. ? ?She is also concerned about elevated blood pressure.  She noticed that her blood pressure was 176/100 on Sunday.  She has been having headaches. Her BP today is 186/102. ? ?Review of Systems ? ?Past Medical History:  ?Diagnosis Date  ? Anemia   ? ? ?Current Outpatient Medications  ?Medication Sig Dispense Refill  ? Insulin Pen Needle 31G X 5 MM MISC BD Pen Needles- brand specific Inject insulin via insulin pen 6 x daily 100 each 0  ? OZEMPIC, 0.25 OR 0.5 MG/DOSE, 2 MG/1.5ML SOPN INJECT 0.25MG  SUBCUTANEOUSLY ONCE A WEEK FOR THE FIRST 4 WEEKS, THEN INCREASE TO 0.5MG  ONCE A WEEK. 2 mL 0  ? ?No current facility-administered medications for this visit.  ? ? ?Allergies  ?Allergen Reactions  ? Penicillins   ? ? ?Family History  ?Problem Relation Age of Onset  ? Diabetes Mother   ? Colon polyps Father   ? ? ?Social History  ? ?Socioeconomic History  ? Marital status: Single  ?  Spouse name: Not on file  ? Number of children: 2  ? Years of education: Not on file  ? Highest education level: Not on file  ?Occupational History  ? Not on file  ?Tobacco Use  ? Smoking status: Never  ? Smokeless tobacco: Never  ?Vaping Use  ? Vaping Use: Never used  ?Substance and Sexual Activity  ? Alcohol use: No  ?  Comment: occassional in social  ?  Drug use: Never  ? Sexual activity: Yes  ?  Partners: Male  ?  Birth control/protection: I.U.D.  ?Other Topics Concern  ? Not on file  ?Social History Narrative  ? Not on file  ? ?Social Determinants of Health  ? ?Financial Resource Strain: Not on file  ?Food Insecurity: Not on file  ?Transportation Needs: Not on file  ?Physical Activity: Not on file  ?Stress: Not on file  ?Social Connections: Not on file  ?Intimate Partner Violence: Not on file  ? ? ? ?Constitutional: Patient reports headaches.  Denies fever, malaise, fatigue, or abrupt weight changes.  ?Respiratory: Denies difficulty breathing, shortness of breath, cough or sputum production.   ?Cardiovascular: Denies chest pain, chest tightness, palpitations or swelling in the hands or feet.  ?Musculoskeletal: Denies decrease in range of motion, difficulty with gait, muscle pain or joint pain and swelling.  ?Skin: Denies redness, rashes, lesions or ulcercations.  ?Neurological: Denies dizziness, difficulty with memory, difficulty with speech or problems with balance and coordination.  ? ? ?No other specific complaints in a complete review of systems (except as listed in HPI above). ? ?   ?Objective:  ? Physical Exam ? ? ?BP (!) 186/102 (BP Location: Right Arm, Patient Position: Sitting, Cuff Size: Large)   Pulse 65  Temp (!) 97.1 ?F (36.2 ?C) (Temporal)   Wt 254 lb (115.2 kg)   SpO2 100%   BMI 38.62 kg/m?  ? ?Wt Readings from Last 3 Encounters:  ?12/10/20 246 lb 3.2 oz (111.7 kg)  ?02/17/20 243 lb 6.4 oz (110.4 kg)  ?02/20/20 239 lb (108.4 kg)  ? ? ?General: Appears her stated age, obese, in NAD. ?Skin: Warm, dry and intact.  ?HEENT: Head: normal shape and size; Eyes: sclera white, no icterus, conjunctiva pink, PERRLA and EOMs intact; E ?Cardiovascular: Normal rate and rhythm. S1,S2 noted.  No murmur, rubs or gallops noted. No JVD or BLE edema. No carotid bruits noted. ?Pulmonary/Chest: Normal effort and positive vesicular breath sounds. No respiratory  distress. No wheezes, rales or ronchi noted.  ?Musculoskeletal: No difficulty with gait.  ?Neurological: Alert and oriented. Cranial nerves II-XII grossly intact. Coordination normal.  ? ? ?BMET ?   ?Component Value Date/Time  ? NA 139 12/10/2020 1053  ? K 4.0 12/10/2020 1053  ? CL 101 12/10/2020 1053  ? CO2 28 12/10/2020 1053  ? GLUCOSE 196 (H) 12/10/2020 1053  ? BUN 13 12/10/2020 1053  ? CREATININE 0.83 12/10/2020 1053  ? CALCIUM 9.7 12/10/2020 1053  ? GFRNONAA 106 07/19/2019 0846  ? GFRAA 123 07/19/2019 0846  ? ? ?Lipid Panel  ?   ?Component Value Date/Time  ? CHOL 212 (H) 12/10/2020 1053  ? TRIG 185 (H) 12/10/2020 1053  ? HDL 54 12/10/2020 1053  ? CHOLHDL 3.9 12/10/2020 1053  ? LDLCALC 127 (H) 12/10/2020 1053  ? ? ?CBC ?   ?Component Value Date/Time  ? WBC 6.3 11/15/2019 1107  ? RBC 5.29 (H) 11/15/2019 1107  ? HGB 16.2 (H) 11/15/2019 1107  ? HCT 48.0 (H) 11/15/2019 1107  ? PLT 282 11/15/2019 1107  ? MCV 90.7 11/15/2019 1107  ? MCH 30.6 11/15/2019 1107  ? MCHC 33.8 11/15/2019 1107  ? RDW 13.2 11/15/2019 1107  ? LYMPHSABS 1,115 11/15/2019 1107  ? MONOABS 0.4 10/25/2019 1059  ? EOSABS 120 11/15/2019 1107  ? BASOSABS 38 11/15/2019 1107  ? ? ?Hgb A1C ?Lab Results  ?Component Value Date  ? HGBA1C 6.0 (H) 12/10/2020  ? ? ? ? ? ? ?   ?Assessment & Plan:  ? ? ?Nicki Reaper, NP ? ?

## 2021-10-22 NOTE — Assessment & Plan Note (Signed)
CBC with differential today 

## 2021-10-23 LAB — COMPLETE METABOLIC PANEL WITH GFR
AG Ratio: 1.1 (calc) (ref 1.0–2.5)
ALT: 23 U/L (ref 6–29)
AST: 17 U/L (ref 10–35)
Albumin: 4.2 g/dL (ref 3.6–5.1)
Alkaline phosphatase (APISO): 130 U/L — ABNORMAL HIGH (ref 31–125)
BUN: 8 mg/dL (ref 7–25)
CO2: 31 mmol/L (ref 20–32)
Calcium: 9.8 mg/dL (ref 8.6–10.2)
Chloride: 101 mmol/L (ref 98–110)
Creat: 0.64 mg/dL (ref 0.50–0.99)
Globulin: 3.9 g/dL (calc) — ABNORMAL HIGH (ref 1.9–3.7)
Glucose, Bld: 128 mg/dL — ABNORMAL HIGH (ref 65–99)
Potassium: 4 mmol/L (ref 3.5–5.3)
Sodium: 140 mmol/L (ref 135–146)
Total Bilirubin: 0.5 mg/dL (ref 0.2–1.2)
Total Protein: 8.1 g/dL (ref 6.1–8.1)
eGFR: 110 mL/min/{1.73_m2} (ref >=60)

## 2021-10-23 LAB — CBC WITH DIFFERENTIAL/PLATELET
Absolute Monocytes: 350 cells/uL (ref 200–950)
Basophils Absolute: 38 cells/uL (ref 0–200)
Basophils Relative: 0.5 %
Eosinophils Absolute: 167 cells/uL (ref 15–500)
Eosinophils Relative: 2.2 %
HCT: 45.9 % — ABNORMAL HIGH (ref 35.0–45.0)
Hemoglobin: 15.6 g/dL — ABNORMAL HIGH (ref 11.7–15.5)
Lymphs Abs: 1193 cells/uL (ref 850–3900)
MCH: 30 pg (ref 27.0–33.0)
MCHC: 34 g/dL (ref 32.0–36.0)
MCV: 88.3 fL (ref 80.0–100.0)
MPV: 11.3 fL (ref 7.5–12.5)
Monocytes Relative: 4.6 %
Neutro Abs: 5852 cells/uL (ref 1500–7800)
Neutrophils Relative %: 77 %
Platelets: 276 10*3/uL (ref 140–400)
RBC: 5.2 10*6/uL — ABNORMAL HIGH (ref 3.80–5.10)
RDW: 13.1 % (ref 11.0–15.0)
Total Lymphocyte: 15.7 %
WBC: 7.6 10*3/uL (ref 3.8–10.8)

## 2021-10-23 LAB — HEMOGLOBIN A1C
Hgb A1c MFr Bld: 6.6 % of total Hgb — ABNORMAL HIGH (ref <5.7)
Mean Plasma Glucose: 143 mg/dL
eAG (mmol/L): 7.9 mmol/L

## 2021-10-23 LAB — LIPID PANEL
Cholesterol: 191 mg/dL (ref <200)
HDL: 50 mg/dL (ref >=50)
LDL Cholesterol (Calc): 115 mg/dL (calc) — ABNORMAL HIGH
Non-HDL Cholesterol (Calc): 141 mg/dL (calc) — ABNORMAL HIGH (ref <130)
Total CHOL/HDL Ratio: 3.8 (calc) (ref <5.0)
Triglycerides: 143 mg/dL (ref <150)

## 2021-10-28 ENCOUNTER — Encounter: Payer: Self-pay | Admitting: Internal Medicine

## 2021-10-28 ENCOUNTER — Ambulatory Visit (INDEPENDENT_AMBULATORY_CARE_PROVIDER_SITE_OTHER): Payer: Commercial Managed Care - PPO | Admitting: Internal Medicine

## 2021-10-28 VITALS — BP 136/82 | HR 67 | Temp 97.1°F | Wt 254.0 lb

## 2021-10-28 DIAGNOSIS — E119 Type 2 diabetes mellitus without complications: Secondary | ICD-10-CM

## 2021-10-28 DIAGNOSIS — Z1211 Encounter for screening for malignant neoplasm of colon: Secondary | ICD-10-CM | POA: Diagnosis not present

## 2021-10-28 DIAGNOSIS — E782 Mixed hyperlipidemia: Secondary | ICD-10-CM | POA: Diagnosis not present

## 2021-10-28 DIAGNOSIS — Z6838 Body mass index (BMI) 38.0-38.9, adult: Secondary | ICD-10-CM

## 2021-10-28 DIAGNOSIS — I10 Essential (primary) hypertension: Secondary | ICD-10-CM | POA: Diagnosis not present

## 2021-10-28 MED ORDER — AMLODIPINE-OLMESARTAN 5-20 MG PO TABS: 1.0000 | ORAL_TABLET | Freq: Every day | ORAL | 0 refills | Status: DC

## 2021-10-28 MED ORDER — OZEMPIC (1 MG/DOSE) 4 MG/3ML ~~LOC~~ SOPN: 1.0000 mg | PEN_INJECTOR | SUBCUTANEOUS | 0 refills | Status: DC

## 2021-10-28 NOTE — Patient Instructions (Signed)

## 2021-10-28 NOTE — Assessment & Plan Note (Signed)
Encourage diet and exercise for weight loss 

## 2021-10-28 NOTE — Assessment & Plan Note (Signed)
Discussed diabetes and standards of medical care ?She will continue Ozempic 2 more weeks and increase to 1 mg daily as prescribed, refilled today ?Reinforced low-carb diet and exercise for weight loss ?Encourage routine eye exams ?Encourage routine foot exams ?She declines flu, Pneumovax and COVID vaccines ?

## 2021-10-28 NOTE — Progress Notes (Signed)
? ?Subjective:  ? ? Patient ID: Shannon Escobar, female    DOB: 06/02/1976, 46 y.o.   MRN: [id number] ? ?HPI ? ?Patient presents to clinic today for 1 week follow-up of HTN.  At her last visit, her BP was elevated at 186/102.  She was started on Amlodipine-Olmesartan.  She has been taking his medication as prescribed and denies adverse side effects.  Her BP today is 136/82.  ECG from 10/2018 were reviewed. ? ?She also needs to discuss her recent labs.  She had an A1c that was 6.6% with LDL 115, new onset diabetes.  She was on Ozempic in the past for prediabetes and weight management however she has been unable to get this medication for the last few months.  She recently restarted this at 0.5 mg.  She does not check her sugars.   Flu never.  Pneumovax never.  COVID [employer]. ? ?Review of Systems ? ?Past Medical History:  ?Diagnosis Date  ? Anemia   ? ? ?Current Outpatient Medications  ?Medication Sig Dispense Refill  ? amLODipine-olmesartan (AZOR) 5-20 MG tablet Take 1 tablet by mouth daily. 30 tablet 0  ? OZEMPIC, 0.25 OR 0.5 MG/DOSE, 2 MG/1.5ML SOPN INJECT 0.25 MG SUBCUTANEOUSLY ONCE A WEEK FOR THE FIRST 4 WEEKS, THEN INCREASE TO 0.5MG  ONCE A WEEK 2 mL 0  ? ?No current facility-administered medications for this visit.  ? ? ?Allergies  ?Allergen Reactions  ? Penicillins   ? ? ?Family History  ?Problem Relation Age of Onset  ? Diabetes Mother   ? Colon polyps Father   ? ? ?Social History  ? ?Socioeconomic History  ? Marital status: Significant Other  ?  Spouse name: Not on file  ? Number of children: 2  ? Years of education: Not on file  ? Highest education level: Not on file  ?Occupational History  ? Not on file  ?Tobacco Use  ? Smoking status: Never  ? Smokeless tobacco: Never  ?Vaping Use  ? Vaping Use: Never used  ?Substance and Sexual Activity  ? Alcohol use: No  ?  Comment: occassional in social  ? Drug use: Never  ? Sexual activity: Yes  ?  Partners: Male  ?  Birth control/protection: I.U.D.  ?Other Topics  Concern  ? Not on file  ?Social History Narrative  ? Not on file  ? ?Social Determinants of Health  ? ?Financial Resource Strain: Not on file  ?Food Insecurity: Not on file  ?Transportation Needs: Not on file  ?Physical Activity: Not on file  ?Stress: Not on file  ?Social Connections: Not on file  ?Intimate Partner Violence: Not on file  ? ? ? ?Constitutional: Pt reports intermittent headaches. Denies fever, malaise, fatigue, or abrupt weight changes.  ?HEENT: Denies eye pain, eye redness, ear pain, ringing in the ears, wax buildup, runny nose, nasal congestion, bloody nose, or sore throat. ?Respiratory: Denies difficulty breathing, shortness of breath, cough or sputum production.   ?Cardiovascular: Denies chest pain, chest tightness, palpitations or swelling in the hands or feet.  ?Gastrointestinal: Denies abdominal pain, bloating, constipation, diarrhea or blood in the stool.  ?GU: Denies urgency, frequency, pain with urination, burning sensation, blood in urine, odor or discharge. ?Musculoskeletal: Denies decrease in range of motion, difficulty with gait, muscle pain or joint pain and swelling.  ?Skin: Denies redness, rashes, lesions or ulcercations.  ?Neurological: Denies dizziness, difficulty with memory, difficulty with speech or problems with balance and coordination.  ?Psych: Denies anxiety, depression, SI/HI. ? ?No other  specific complaints in a complete review of systems (except as listed in HPI above). ? ?   ?Objective:  ? Physical Exam ? ?BP 136/82 (BP Location: Left Arm, Patient Position: Sitting, Cuff Size: Large)   Pulse 67   Temp (!) 97.1 ?F (36.2 ?C) (Temporal)   Wt 254 lb (115.2 kg)   SpO2 99%   BMI 38.62 kg/m?  ? ?Wt Readings from Last 3 Encounters:  ?10/22/21 254 lb (115.2 kg)  ?12/10/20 246 lb 3.2 oz (111.7 kg)  ?02/17/20 243 lb 6.4 oz (110.4 kg)  ? ? ?General: Appears her stated age, obese in NAD. ?Skin: Warm, dry and intact. No ulcerations noted. ?HEENT: Head: normal shape and size; Eyes:  sclera white, no icterus, conjunctiva pink, PERRLA and EOMs intact;  ?Cardiovascular: Normal rate. ?Pulmonary/Chest: Normal effort. ?Musculoskeletal: No difficulty with gait.  ?Neurological: Alert and oriented.  ? ?BMET ?   ?Component Value Date/Time  ? NA 140 10/22/2021 1104  ? K 4.0 10/22/2021 1104  ? CL 101 10/22/2021 1104  ? CO2 31 10/22/2021 1104  ? GLUCOSE 128 (H) 10/22/2021 1104  ? BUN 8 10/22/2021 1104  ? CREATININE 0.64 10/22/2021 1104  ? CALCIUM 9.8 10/22/2021 1104  ? GFRNONAA 106 07/19/2019 0846  ? GFRAA 123 07/19/2019 0846  ? ? ?Lipid Panel  ?   ?Component Value Date/Time  ? CHOL 191 10/22/2021 1104  ? TRIG 143 10/22/2021 1104  ? HDL 50 10/22/2021 1104  ? CHOLHDL 3.8 10/22/2021 1104  ? LDLCALC 115 (H) 10/22/2021 1104  ? ? ?CBC ?   ?Component Value Date/Time  ? WBC 7.6 10/22/2021 1104  ? RBC 5.20 (H) 10/22/2021 1104  ? HGB 15.6 (H) 10/22/2021 1104  ? HCT 45.9 (H) 10/22/2021 1104  ? PLT 276 10/22/2021 1104  ? MCV 88.3 10/22/2021 1104  ? MCH 30.0 10/22/2021 1104  ? MCHC 34.0 10/22/2021 1104  ? RDW 13.1 10/22/2021 1104  ? LYMPHSABS 1,193 10/22/2021 1104  ? MONOABS 0.4 10/25/2019 1059  ? EOSABS 167 10/22/2021 1104  ? BASOSABS 38 10/22/2021 1104  ? ? ?Hgb A1C ?Lab Results  ?Component Value Date  ? HGBA1C 6.6 (H) 10/22/2021  ? ? ? ? ? ? ?   ?Assessment & Plan:  ? ? ?Nicki Reaper, NP ? ? ?

## 2021-10-28 NOTE — Assessment & Plan Note (Signed)
She does not want to start statin therapy at this time ?Encouraged her to consume a low-carb, low-fat diet and exercise for weight loss ?We will recheck in 3 months at follow-up ?

## 2021-10-28 NOTE — Assessment & Plan Note (Signed)
Controlled on Amlodipine-Olmesartan, refilled today ?Reinforced DASH diet and exercise for weight loss ?We will monitor ?

## 2021-10-29 ENCOUNTER — Encounter: Payer: Self-pay | Admitting: Internal Medicine

## 2021-10-29 ENCOUNTER — Other Ambulatory Visit: Payer: Self-pay

## 2021-10-29 DIAGNOSIS — Z1211 Encounter for screening for malignant neoplasm of colon: Secondary | ICD-10-CM

## 2021-10-29 MED ORDER — PEG 3350-KCL-NA BICARB-NACL 420 G PO SOLR: 4000.0000 mL | Freq: Once | ORAL | 0 refills | Status: AC

## 2021-10-29 NOTE — Progress Notes (Signed)
Gastroenterology Pre-Procedure Review ? ?Request Date: 12/09/2021 ?Requesting Physician: Dr. Vicente Males ? ? ?PATIENT REVIEW QUESTIONS: The patient responded to the following health history questions as indicated:   ? ?1. Are you having any GI issues? no ?2. Do you have a personal history of Polyps? no ?3. Do you have a family history of Colon Cancer or Polyps? YES COLON CANCER ?4. Diabetes Mellitus? no ?5. Joint replacements in the past 12 months?no ?6. Major health problems in the past 3 months?no ?7. Any artificial heart valves, MVP, or defibrillator?no ?   ?MEDICATIONS & ALLERGIES:    ?Patient reports the following regarding taking any anticoagulation/antiplatelet therapy:   ?Plavix, Coumadin, Eliquis, Xarelto, Lovenox, Pradaxa, Brilinta, or Effient? no ?Aspirin? no ? ?Patient confirms/reports the following medications:  ?Current Outpatient Medications  ?Medication Sig Dispense Refill  ? amLODipine-olmesartan (AZOR) 5-20 MG tablet Take 1 tablet by mouth daily. 90 tablet 0  ? OZEMPIC, 1 MG/DOSE, 4 MG/3ML SOPN Inject 1 mg into the skin once a week. 9 mL 0  ? ?No current facility-administered medications for this visit.  ? ? ?Patient confirms/reports the following allergies:  ?Allergies  ?Allergen Reactions  ? Penicillins   ? ? ?No orders of the defined types were placed in this encounter. ? ? ?AUTHORIZATION INFORMATION ?Primary Insurance: ?1D#: ?Group #: ? ?Secondary Insurance: ?1D#: ?Group #: ? ?SCHEDULE INFORMATION: ?Date: 12/09/2021 ?Time: ?Location: [facility] ?

## 2021-12-09 ENCOUNTER — Encounter: Payer: Self-pay | Admitting: Anesthesiology

## 2021-12-09 ENCOUNTER — Encounter: Admission: RE | Payer: Self-pay | Source: Home / Self Care

## 2021-12-09 ENCOUNTER — Ambulatory Visit
Admission: RE | Admit: 2021-12-09 | Payer: Commercial Managed Care - PPO | Source: Home / Self Care | Admitting: Gastroenterology

## 2021-12-09 SURGERY — COLONOSCOPY WITH PROPOFOL
Anesthesia: General

## 2021-12-09 NOTE — Anesthesia Preprocedure Evaluation (Signed)
Anesthesia Evaluation  Patient identified by MRN, date of birth, ID band Patient awake    Reviewed: Allergy & Precautions, H&P , NPO status , Patient's Chart, lab work & pertinent test results, reviewed documented beta blocker date and time   Airway Mallampati: II   Neck ROM: full    Dental  (+) Poor Dentition   Pulmonary neg pulmonary ROS,    Pulmonary exam normal        Cardiovascular negative cardio ROS Normal cardiovascular exam Rhythm:regular Rate:Normal     Neuro/Psych negative neurological ROS  negative psych ROS   GI/Hepatic negative GI ROS, Neg liver ROS,   Endo/Other  negative endocrine ROS  Renal/GU negative Renal ROS  negative genitourinary   Musculoskeletal   Abdominal   Peds  Hematology  (+) Blood dyscrasia, anemia ,   Anesthesia Other Findings Past Medical History: No date: Anemia Past Surgical History: No date: ABDOMINAL SURGERY 2011: BREAST CYST ASPIRATION; Right     Comment:  abcess drained No date: CESAREAN SECTION     Comment:  x2 No date: CHOLECYSTECTOMY No date: TUBAL LIGATION   Reproductive/Obstetrics negative OB ROS                             Anesthesia Physical Anesthesia Plan  ASA: 2  Anesthesia Plan: General   Post-op Pain Management:    Induction:   PONV Risk Score and Plan:   Airway Management Planned:   Additional Equipment:   Intra-op Plan:   Post-operative Plan:   Informed Consent: I have reviewed the patients History and Physical, chart, labs and discussed the procedure including the risks, benefits and alternatives for the proposed anesthesia with the patient or authorized representative who has indicated his/her understanding and acceptance.     Dental Advisory Given  Plan Discussed with: CRNA  Anesthesia Plan Comments:         Anesthesia Quick Evaluation

## 2022-01-17 ENCOUNTER — Other Ambulatory Visit: Payer: Self-pay | Admitting: Internal Medicine

## 2022-01-20 NOTE — Telephone Encounter (Signed)
Requested Prescriptions  Pending Prescriptions Disp Refills  . OZEMPIC, 1 MG/DOSE, 4 MG/3ML SOPN [Pharmacy Med Name: Ozempic (1 MG/DOSE) 4 MG/3ML Subcutaneous Solution Pen-injector] 9 mL 0    Sig: INJECT 1 DOSE (1MG ) SUBCUTANEOUSLY ONCE A WEEK     Endocrinology:  Diabetes - GLP-1 Receptor Agonists - semaglutide Failed - 01/17/2022 12:12 PM      Failed - HBA1C in normal range and within 180 days    Hgb A1c MFr Bld  Date Value Ref Range Status  10/22/2021 6.6 (H) <5.7 % of total Hgb Final    Comment:    For someone without known diabetes, a hemoglobin A1c value of 6.5% or greater indicates that they may have  diabetes and this should be confirmed with a follow-up  test. . For someone with known diabetes, a value <7% indicates  that their diabetes is well controlled and a value  greater than or equal to 7% indicates suboptimal  control. A1c targets should be individualized based on  duration of diabetes, age, comorbid conditions, and  other considerations. . Currently, no consensus exists regarding use of hemoglobin A1c for diagnosis of diabetes for children. .          Passed - Cr in normal range and within 360 days    Creat  Date Value Ref Range Status  10/22/2021 0.64 0.50 - 0.99 mg/dL Final         Passed - Valid encounter within last 6 months    Recent Outpatient Visits          2 months ago Screening for colon cancer   [facility] [place], Mullins, NP   3 months ago Mixed hyperlipidemia   [facility] [place], Mullins, NP   1 year ago Prediabetes   [facility] [place], Mullins, NP   1 year ago Obesity (BMI 35.0-39.9 without comorbidity)   [facility], MD   2 years ago Obesity (BMI 35.0-39.9 without comorbidity)   [facility], MD      Future Appointments            In 3 weeks Baity, Alba Cory, NP [facility], [facility]

## 2022-02-03 ENCOUNTER — Ambulatory Visit: Payer: Commercial Managed Care - PPO | Admitting: Internal Medicine

## 2022-02-10 ENCOUNTER — Ambulatory Visit: Payer: Commercial Managed Care - PPO | Admitting: Internal Medicine

## 2022-02-10 NOTE — Progress Notes (Deleted)
Subjective:    Patient ID: Shannon Escobar, female    DOB: 08-22-1975, 46 y.o.   MRN: [id number]  HPI  Pt presents  to the clinic today for 3 months follow up of chronic conditions.  Polycythemia: Her last H/H was 15.6/45.9, 10/2021. She is not taking any oral iron or getting iron infusions. She no longer follows with hematology.  DM 2: Her last A1C was 6.6 %, 10/2021. She is taking Ozempic as prescribed. She does not check her sugars.  HLD: Her last LDL was 115, triglycerides 143, 10/2021. She is not taking any cholesterol-lowering medication at this time. She does not consume a low fat diet.  HTN: Her BP today is. She is taking Amlodipine-Olmesartan as prescribed. ECG from 10/2019 reviewed.  Review of Systems     Past Medical History:  Diagnosis Date   Anemia     Current Outpatient Medications  Medication Sig Dispense Refill   amLODipine-olmesartan (AZOR) 5-20 MG tablet Take 1 tablet by mouth daily. 90 tablet 0   OZEMPIC, 1 MG/DOSE, 4 MG/3ML SOPN INJECT 1 DOSE (1MG ) SUBCUTANEOUSLY ONCE A WEEK 9 mL 0   No current facility-administered medications for this visit.    Allergies  Allergen Reactions   Penicillins     Family History  Problem Relation Age of Onset   Diabetes Mother    Colon polyps Father     Social History   Socioeconomic History   Marital status: Significant Other    Spouse name: Not on file   Number of children: 2   Years of education: Not on file   Highest education level: Not on file  Occupational History   Not on file  Tobacco Use   Smoking status: Never   Smokeless tobacco: Never  Vaping Use   Vaping Use: Never used  Substance and Sexual Activity   Alcohol use: No    Comment: occassional in social   Drug use: Never   Sexual activity: Yes    Partners: Male    Birth control/protection: I.U.D.  Other Topics Concern   Not on file  Social History Narrative   Not on file   Social Determinants of Health   Financial Resource Strain: Low  Risk  (04/08/2019)   Overall Financial Resource Strain (CARDIA)    Difficulty of Paying Living Expenses: Not hard at all  Food Insecurity: No Food Insecurity (04/08/2019)   Hunger Vital Sign    Worried About Running Out of Food in the Last Year: Never true    Ran Out of Food in the Last Year: Never true  Transportation Needs: No Transportation Needs (04/08/2019)   PRAPARE - 04/10/2019 (Medical): No    Lack of Transportation (Non-Medical): No  Physical Activity: Sufficiently Active (04/08/2019)   Exercise Vital Sign    Days of Exercise per Week: 5 days    Minutes of Exercise per Session: 30 min  Stress: No Stress Concern Present (04/08/2019)   04/10/2019 of Occupational Health - Occupational Stress Questionnaire    Feeling of Stress : Only a little  Social Connections: Moderately Isolated (04/08/2019)   Social Connection and Isolation Panel [NHANES]    Frequency of Communication with Friends and Family: More than three times a week    Frequency of Social Gatherings with Friends and Family: Never    Attends Religious Services: More than 4 times per year    Active Member of 04/10/2019 or Organizations: No    Attends Club or  Organization Meetings: Never    Marital Status: Never married  Intimate Partner Violence: Not At Risk (04/08/2019)   Humiliation, Afraid, Rape, and Kick questionnaire    Fear of Current or Ex-Partner: No    Emotionally Abused: No    Physically Abused: No    Sexually Abused: No     Constitutional: Denies fever, malaise, fatigue, headache or abrupt weight changes.  HEENT: Denies eye pain, eye redness, ear pain, ringing in the ears, wax buildup, runny nose, nasal congestion, bloody nose, or sore throat. Respiratory: Denies difficulty breathing, shortness of breath, cough or sputum production.   Cardiovascular: Denies chest pain, chest tightness, palpitations or swelling in the hands or feet.  Gastrointestinal: Denies abdominal pain,  bloating, constipation, diarrhea or blood in the stool.  GU: Denies urgency, frequency, pain with urination, burning sensation, blood in urine, odor or discharge. Musculoskeletal: Denies decrease in range of motion, difficulty with gait, muscle pain or joint pain and swelling.  Skin: Denies redness, rashes, lesions or ulcercations.  Neurological: Denies dizziness, difficulty with memory, difficulty with speech or problems with balance and coordination.  Psych: Denies anxiety, depression, SI/HI.  No other specific complaints in a complete review of systems (except as listed in HPI above).  Objective:   Physical Exam  There were no vitals taken for this visit. Wt Readings from Last 3 Encounters:  10/28/21 254 lb (115.2 kg)  10/22/21 254 lb (115.2 kg)  12/10/20 246 lb 3.2 oz (111.7 kg)    General: Appears their stated age, well developed, well nourished in NAD. Skin: Warm, dry and intact. No rashes, lesions or ulcerations noted. HEENT: Head: normal shape and size; Eyes: sclera white, no icterus, conjunctiva pink, PERRLA and EOMs intact; Ears: Tm's gray and intact, normal light reflex; Nose: mucosa pink and moist, septum midline; Throat/Mouth: Teeth present, mucosa pink and moist, no exudate, lesions or ulcerations noted.  Neck:  Neck supple, trachea midline. No masses, lumps or thyromegaly present.  Cardiovascular: Normal rate and rhythm. S1,S2 noted.  No murmur, rubs or gallops noted. No JVD or BLE edema. No carotid bruits noted. Pulmonary/Chest: Normal effort and positive vesicular breath sounds. No respiratory distress. No wheezes, rales or ronchi noted.  Abdomen: Soft and nontender. Normal bowel sounds. No distention or masses noted. Liver, spleen and kidneys non palpable. Musculoskeletal: Normal range of motion. No signs of joint swelling. No difficulty with gait.  Neurological: Alert and oriented. Cranial nerves II-XII grossly intact. Coordination normal.  Psychiatric: Mood and affect  normal. Behavior is normal. Judgment and thought content normal.    BMET    Component Value Date/Time   NA 140 10/22/2021 1104   K 4.0 10/22/2021 1104   CL 101 10/22/2021 1104   CO2 31 10/22/2021 1104   GLUCOSE 128 (H) 10/22/2021 1104   BUN 8 10/22/2021 1104   CREATININE 0.64 10/22/2021 1104   CALCIUM 9.8 10/22/2021 1104   GFRNONAA 106 07/19/2019 0846   GFRAA 123 07/19/2019 0846    Lipid Panel     Component Value Date/Time   CHOL 191 10/22/2021 1104   TRIG 143 10/22/2021 1104   HDL 50 10/22/2021 1104   CHOLHDL 3.8 10/22/2021 1104   LDLCALC 115 (H) 10/22/2021 1104    CBC    Component Value Date/Time   WBC 7.6 10/22/2021 1104   RBC 5.20 (H) 10/22/2021 1104   HGB 15.6 (H) 10/22/2021 1104   HCT 45.9 (H) 10/22/2021 1104   PLT 276 10/22/2021 1104   MCV 88.3 10/22/2021 1104  MCH 30.0 10/22/2021 1104   MCHC 34.0 10/22/2021 1104   RDW 13.1 10/22/2021 1104   LYMPHSABS 1,193 10/22/2021 1104   MONOABS 0.4 10/25/2019 1059   EOSABS 167 10/22/2021 1104   BASOSABS 38 10/22/2021 1104    Hgb A1C Lab Results  Component Value Date   HGBA1C 6.6 (H) 10/22/2021            Assessment & Plan:      RTC in 6 months for your annual exam Nicki Reaper, NP

## 2022-03-03 ENCOUNTER — Ambulatory Visit: Payer: Commercial Managed Care - PPO | Admitting: Internal Medicine

## 2022-03-10 ENCOUNTER — Ambulatory Visit: Payer: Commercial Managed Care - PPO | Admitting: Internal Medicine

## 2022-03-10 NOTE — Progress Notes (Deleted)
Subjective:    Patient ID: Shannon Escobar, female    DOB: 15-Dec-1975, 46 y.o.   MRN: [id number]  HPI  Patient presents to clinic today for her annual exam.  Flu: Tetanus: 01/2020 COVID: Pfizer x2 Pneumovax: Never Pap smear: 07/2019 Mammogram: 08/2018 Colon screening: Vision screening: Dentist:  Diet: Exercise:  Review of Systems     Past Medical History:  Diagnosis Date   Anemia     Current Outpatient Medications  Medication Sig Dispense Refill   amLODipine-olmesartan (AZOR) 5-20 MG tablet Take 1 tablet by mouth daily. 90 tablet 0   OZEMPIC, 1 MG/DOSE, 4 MG/3ML SOPN INJECT 1 DOSE (1MG) SUBCUTANEOUSLY ONCE A WEEK 9 mL 0   No current facility-administered medications for this visit.    Allergies  Allergen Reactions   Penicillins     Family History  Problem Relation Age of Onset   Diabetes Mother    Colon polyps Father     Social History   Socioeconomic History   Marital status: Significant Other    Spouse name: Not on file   Number of children: 2   Years of education: Not on file   Highest education level: Not on file  Occupational History   Not on file  Tobacco Use   Smoking status: Never   Smokeless tobacco: Never  Vaping Use   Vaping Use: Never used  Substance and Sexual Activity   Alcohol use: No    Comment: occassional in social   Drug use: Never   Sexual activity: Yes    Partners: Male    Birth control/protection: I.U.D.  Other Topics Concern   Not on file  Social History Narrative   Not on file   Social Determinants of Health   Financial Resource Strain: Low Risk  (04/08/2019)   Overall Financial Resource Strain (CARDIA)    Difficulty of Paying Living Expenses: Not hard at all  Food Insecurity: No Food Insecurity (04/08/2019)   Hunger Vital Sign    Worried About Running Out of Food in the Last Year: Never true    Ran Out of Food in the Last Year: Never true  Transportation Needs: No Transportation Needs (04/08/2019)   PRAPARE -  Hydrologist (Medical): No    Lack of Transportation (Non-Medical): No  Physical Activity: Sufficiently Active (04/08/2019)   Exercise Vital Sign    Days of Exercise per Week: 5 days    Minutes of Exercise per Session: 30 min  Stress: No Stress Concern Present (04/08/2019)   [place]    Feeling of Stress : Only a little  Social Connections: Moderately Isolated (04/08/2019)   Social Connection and Isolation Panel [NHANES]    Frequency of Communication with Friends and Family: More than three times a week    Frequency of Social Gatherings with Friends and Family: Never    Attends Religious Services: More than 4 times per year    Active Member of [employer] or Organizations: No    Attends Archivist Meetings: Never    Marital Status: Never married  Intimate Partner Violence: Not At Risk (04/08/2019)   Humiliation, Afraid, Rape, and Kick questionnaire    Fear of Current or Ex-Partner: No    Emotionally Abused: No    Physically Abused: No    Sexually Abused: No     Constitutional: Denies fever, malaise, fatigue, headache or abrupt weight changes.  HEENT: Denies eye pain, eye redness, ear  pain, ringing in the ears, wax buildup, runny nose, nasal congestion, bloody nose, or sore throat. Respiratory: Denies difficulty breathing, shortness of breath, cough or sputum production.   Cardiovascular: Denies chest pain, chest tightness, palpitations or swelling in the hands or feet.  Gastrointestinal: Denies abdominal pain, bloating, constipation, diarrhea or blood in the stool.  GU: Denies urgency, frequency, pain with urination, burning sensation, blood in urine, odor or discharge. Musculoskeletal: Denies decrease in range of motion, difficulty with gait, muscle pain or joint pain and swelling.  Skin: Denies redness, rashes, lesions or ulcercations.  Neurological: Denies dizziness, difficulty  with memory, difficulty with speech or problems with balance and coordination.  Psych: Denies anxiety, depression, SI/HI.  No other specific complaints in a complete review of systems (except as listed in HPI above).  Objective:   Physical Exam   There were no vitals taken for this visit. Wt Readings from Last 3 Encounters:  10/28/21 254 lb (115.2 kg)  10/22/21 254 lb (115.2 kg)  12/10/20 246 lb 3.2 oz (111.7 kg)    General: Appears their stated age, well developed, well nourished in NAD. Skin: Warm, dry and intact. No rashes, lesions or ulcerations noted. HEENT: Head: normal shape and size; Eyes: sclera white, no icterus, conjunctiva pink, PERRLA and EOMs intact; Ears: Tm's gray and intact, normal light reflex; Nose: mucosa pink and moist, septum midline; Throat/Mouth: Teeth present, mucosa pink and moist, no exudate, lesions or ulcerations noted.  Neck:  Neck supple, trachea midline. No masses, lumps or thyromegaly present.  Cardiovascular: Normal rate and rhythm. S1,S2 noted.  No murmur, rubs or gallops noted. No JVD or BLE edema. No carotid bruits noted. Pulmonary/Chest: Normal effort and positive vesicular breath sounds. No respiratory distress. No wheezes, rales or ronchi noted.  Abdomen: Soft and nontender. Normal bowel sounds. No distention or masses noted. Liver, spleen and kidneys non palpable. Musculoskeletal: Normal range of motion. No signs of joint swelling. No difficulty with gait.  Neurological: Alert and oriented. Cranial nerves II-XII grossly intact. Coordination normal.  Psychiatric: Mood and affect normal. Behavior is normal. Judgment and thought content normal.    BMET    Component Value Date/Time   NA 140 10/22/2021 1104   K 4.0 10/22/2021 1104   CL 101 10/22/2021 1104   CO2 31 10/22/2021 1104   GLUCOSE 128 (H) 10/22/2021 1104   BUN 8 10/22/2021 1104   CREATININE 0.64 10/22/2021 1104   CALCIUM 9.8 10/22/2021 1104   GFRNONAA 106 07/19/2019 0846   GFRAA 123  07/19/2019 0846    Lipid Panel     Component Value Date/Time   CHOL 191 10/22/2021 1104   TRIG 143 10/22/2021 1104   HDL 50 10/22/2021 1104   CHOLHDL 3.8 10/22/2021 1104   LDLCALC 115 (H) 10/22/2021 1104    CBC    Component Value Date/Time   WBC 7.6 10/22/2021 1104   RBC 5.20 (H) 10/22/2021 1104   HGB 15.6 (H) 10/22/2021 1104   HCT 45.9 (H) 10/22/2021 1104   PLT 276 10/22/2021 1104   MCV 88.3 10/22/2021 1104   MCH 30.0 10/22/2021 1104   MCHC 34.0 10/22/2021 1104   RDW 13.1 10/22/2021 1104   LYMPHSABS 1,193 10/22/2021 1104   MONOABS 0.4 10/25/2019 1059   EOSABS 167 10/22/2021 1104   BASOSABS 38 10/22/2021 1104    Hgb A1C Lab Results  Component Value Date   HGBA1C 6.6 (H) 10/22/2021           Assessment & Plan:  Preventative  Health Maintenance:  She declines flu shot today Tetanus UTD Encouraged her to get her COVID booster Pneumovax today Pap smear UTD Mammogram ordered-she will call to schedule Referral to GI for screening colonoscopy Encouraged her to consume a balanced diet and exercise regimen Advised her to see an eye doctor and dentist annually We will check CBC, c-Met, lipid, A1c and urine microalbumin today  RTC in 6 months, follow-up chronic conditions Webb Silversmith, NP

## 2022-07-07 ENCOUNTER — Other Ambulatory Visit: Payer: Self-pay

## 2022-07-07 ENCOUNTER — Encounter: Payer: Self-pay | Admitting: Radiology

## 2022-07-07 ENCOUNTER — Emergency Department
Admission: EM | Admit: 2022-07-07 | Discharge: 2022-07-07 | Disposition: A | Discharge status: Home / Self Care | Payer: Self-pay | Attending: Emergency Medicine | Admitting: Emergency Medicine

## 2022-07-07 ENCOUNTER — Emergency Department: Payer: Self-pay

## 2022-07-07 ENCOUNTER — Encounter: Payer: Self-pay | Admitting: Oncology

## 2022-07-07 DIAGNOSIS — J101 Influenza due to other identified influenza virus with other respiratory manifestations: Secondary | ICD-10-CM | POA: Insufficient documentation

## 2022-07-07 DIAGNOSIS — Z20822 Contact with and (suspected) exposure to covid-19: Secondary | ICD-10-CM | POA: Insufficient documentation

## 2022-07-07 LAB — CBC WITH DIFFERENTIAL/PLATELET
Abs Immature Granulocytes: 0.02 10*3/uL (ref 0.00–0.07)
Basophils Absolute: 0 10*3/uL (ref 0.0–0.1)
Basophils Relative: 0 %
Eosinophils Absolute: 0.1 10*3/uL (ref 0.0–0.5)
Eosinophils Relative: 1 %
HCT: 43.8 % (ref 36.0–46.0)
Hemoglobin: 14.4 g/dL (ref 12.0–15.0)
Immature Granulocytes: 0 %
Lymphocytes Relative: 19 %
Lymphs Abs: 2.1 10*3/uL (ref 0.7–4.0)
MCH: 29.1 pg (ref 26.0–34.0)
MCHC: 32.9 g/dL (ref 30.0–36.0)
MCV: 88.5 fL (ref 80.0–100.0)
Monocytes Absolute: 0.5 10*3/uL (ref 0.1–1.0)
Monocytes Relative: 5 %
Neutro Abs: 8.5 10*3/uL — ABNORMAL HIGH (ref 1.7–7.7)
Neutrophils Relative %: 75 %
Platelets: 258 10*3/uL (ref 150–400)
RBC: 4.95 MIL/uL (ref 3.87–5.11)
RDW: 13.4 % (ref 11.5–15.5)
WBC: 11.2 10*3/uL — ABNORMAL HIGH (ref 4.0–10.5)
nRBC: 0 % (ref 0.0–0.2)

## 2022-07-07 LAB — RESP PANEL BY RT-PCR (RSV, FLU A&B, COVID)  RVPGX2
Influenza A by PCR: POSITIVE — AB
Influenza B by PCR: NEGATIVE
Resp Syncytial Virus by PCR: NEGATIVE
SARS Coronavirus 2 by RT PCR: NEGATIVE

## 2022-07-07 LAB — COMPREHENSIVE METABOLIC PANEL
ALT: 32 U/L (ref 0–44)
AST: 22 U/L (ref 15–41)
Albumin: 3.9 g/dL (ref 3.5–5.0)
Alkaline Phosphatase: 115 U/L (ref 38–126)
Anion gap: 8 (ref 5–15)
BUN: 10 mg/dL (ref 6–20)
CO2: 27 mmol/L (ref 22–32)
Calcium: 8.8 mg/dL — ABNORMAL LOW (ref 8.9–10.3)
Chloride: 103 mmol/L (ref 98–111)
Creatinine, Ser: 0.75 mg/dL (ref 0.44–1.00)
GFR, Estimated: >60 mL/min (ref >60)
Glucose, Bld: 162 mg/dL — ABNORMAL HIGH (ref 70–99)
Potassium: 3.8 mmol/L (ref 3.5–5.1)
Sodium: 138 mmol/L (ref 135–145)
Total Bilirubin: 0.7 mg/dL (ref 0.3–1.2)
Total Protein: 8.2 g/dL — ABNORMAL HIGH (ref 6.5–8.1)

## 2022-07-07 LAB — GROUP A STREP BY PCR: Group A Strep by PCR: NOT DETECTED

## 2022-07-07 LAB — TROPONIN I (HIGH SENSITIVITY): Troponin I (High Sensitivity): 4 ng/L (ref <18)

## 2022-07-07 MED ORDER — ONDANSETRON 4 MG PO TBDP
4.0000 mg | ORAL_TABLET | Freq: Once | ORAL | Status: AC
  Administered 2022-07-07: 4 mg via ORAL
  Filled 2022-07-07: qty 1

## 2022-07-07 MED ORDER — ONDANSETRON 4 MG PO TBDP: 4.0000 mg | ORAL_TABLET | Freq: Four times a day (QID) | ORAL | 0 refills | Status: DC | PRN

## 2022-07-07 MED ORDER — IBUPROFEN 800 MG PO TABS
800.0000 mg | ORAL_TABLET | Freq: Once | ORAL | Status: AC
  Administered 2022-07-07: 800 mg via ORAL
  Filled 2022-07-07: qty 1

## 2022-07-07 MED ORDER — OXYMETAZOLINE HCL 0.05 % NA SOLN
2.0000 | Freq: Once | NASAL | Status: AC
  Administered 2022-07-07: 2 via NASAL
  Filled 2022-07-07: qty 30

## 2022-07-07 MED ORDER — HYDROCOD POLI-CHLORPHE POLI ER 10-8 MG/5ML PO SUER: 5.0000 mL | Freq: Two times a day (BID) | ORAL | 0 refills | Status: DC | PRN

## 2022-07-07 MED ORDER — HYDROCOD POLI-CHLORPHE POLI ER 10-8 MG/5ML PO SUER
5.0000 mL | Freq: Once | ORAL | Status: AC
  Administered 2022-07-07: 5 mL via ORAL
  Filled 2022-07-07: qty 5

## 2022-07-07 NOTE — ED Triage Notes (Addendum)
Patient reports nasal congestion, productive cough and shortness of breath that began Friday. Reports sore throat since yesterday. AOX4. Frequent cough during triage. Speaking in full sentences with hoarse voice. Denies fever.

## 2022-07-07 NOTE — ED Provider Notes (Signed)
[facility] Provider Note    Event Date/Time   First MD Initiated Contact with Patient 07/07/22 2301     (approximate)   History   Sore Throat, Nasal Congestion, and Cough   HPI  Shannon Escobar is a 47 y.o. female with history of anemia, obesity who presents to the emergency department complaints of fevers, chills, body aches, cough, sore throat, laryngitis, nausea and vomiting that have been ongoing since Friday, January 12.  Unvaccinated for influenza.   History provided by patient.    Past Medical History:  Diagnosis Date   Anemia     Past Surgical History:  Procedure Laterality Date   ABDOMINAL SURGERY     BREAST CYST ASPIRATION Right 2011   abcess drained   CESAREAN SECTION     x2   CHOLECYSTECTOMY     TUBAL LIGATION      MEDICATIONS:  Prior to Admission medications   Medication Sig Start Date End Date Taking? Authorizing Provider  amLODipine-olmesartan (AZOR) 5-20 MG tablet Take 1 tablet by mouth daily. 10/28/21   Jearld Fenton, NP  OZEMPIC, 1 MG/DOSE, 4 MG/3ML SOPN INJECT 1 DOSE (1MG ) SUBCUTANEOUSLY ONCE A WEEK 01/20/22   Jearld Fenton, NP    Physical Exam   Triage Vital Signs: ED Triage Vitals  Enc Vitals Group     BP 07/07/22 2002 (!) 161/99     Pulse Rate 07/07/22 2002 89     Resp 07/07/22 2002 20     Temp 07/07/22 2002 98.6 F (37 C)     Temp src --      SpO2 07/07/22 2002 97 %     Weight 07/07/22 2003 245 lb (111.1 kg)     Height 07/07/22 2003 5\' 7"  (1.702 m)     Head Circumference --      Peak Flow --      Pain Score 07/07/22 2003 10     Pain Loc --      Pain Edu? --      Excl. in [place]? --     Most recent vital signs: Vitals:   07/07/22 2002  BP: (!) 161/99  Pulse: 89  Resp: 20  Temp: 98.6 F (37 C)  SpO2: 97%    CONSTITUTIONAL: Alert and oriented and responds appropriately to questions.  Appears uncomfortable but nontoxic, afebrile HEAD: Normocephalic, atraumatic EYES: Conjunctivae clear, pupils  appear equal, sclera nonicteric ENT: normal nose; moist mucous membranes, patient has some clear rhinorrhea NECK: Supple, normal ROM no meningismus CARD: RRR; S1 and S2 appreciated; no murmurs, no clicks, no rubs, no gallops RESP: Normal chest excursion without splinting or tachypnea; breath sounds clear and equal bilaterally; no wheezes, no rhonchi, no rales, no hypoxia or respiratory distress, speaking full sentences ABD/GI: Normal bowel sounds; non-distended; soft, non-tender, no rebound, no guarding, no peritoneal signs BACK: The back appears normal EXT: Normal ROM in all joints; no deformity noted, no edema; no cyanosis SKIN: Normal color for age and race; warm; no rash on exposed skin NEURO: Moves all extremities equally, normal speech, ambulates with normal gait PSYCH: The patient's mood and manner are appropriate.   ED Results / Procedures / Treatments   LABS: (all labs ordered are listed, but only abnormal results are displayed) Labs Reviewed  RESP PANEL BY RT-PCR (RSV, FLU A&B, COVID)  RVPGX2 - Abnormal; Notable for the following components:      Result Value   Influenza A by PCR POSITIVE (*)  All other components within normal limits  CBC WITH DIFFERENTIAL/PLATELET - Abnormal; Notable for the following components:   WBC 11.2 (*)    Neutro Abs 8.5 (*)    All other components within normal limits  COMPREHENSIVE METABOLIC PANEL - Abnormal; Notable for the following components:   Glucose, Bld 162 (*)    Calcium 8.8 (*)    Total Protein 8.2 (*)    All other components within normal limits  GROUP A STREP BY PCR  TROPONIN I (HIGH SENSITIVITY)     EKG:  EKG Interpretation  Date/Time:  Monday July 07 2022 20:08:06 EST Ventricular Rate:  84 PR Interval:  140 QRS Duration: 78 QT Interval:  380 QTC Calculation: 449 R Axis:   11 Text Interpretation: Normal sinus rhythm Cannot rule out Anterior infarct , age undetermined Abnormal ECG No previous ECGs available  Confirmed by Pryor Curia [phone]) on 07/07/2022 11:15:46 PM         RADIOLOGY: My personal review and interpretation of imaging: Chest x-ray clear.  I have personally reviewed all radiology reports.   DG Chest 2 View  Result Date: 07/07/2022 CLINICAL DATA:  Shortness of breath EXAM: CHEST - 2 VIEW COMPARISON:  11/15/2019 FINDINGS: The heart size and mediastinal contours are within normal limits. Both lungs are clear. The visualized skeletal structures are unremarkable. IMPRESSION: No active cardiopulmonary disease. Electronically Signed   By: Rolm Baptise M.D.   On: 07/07/2022 20:58     PROCEDURES:  Critical Care performed: No      Procedures    IMPRESSION / MDM / ASSESSMENT AND PLAN / ED COURSE  I reviewed the triage vital signs and the nursing notes.    Patient here with flulike symptoms.    DIFFERENTIAL DIAGNOSIS (includes but not limited to):   Influenza, COVID, other viral URI, pneumonia, doubt bacteremia, sepsis, meningitis   Patient's presentation is most consistent with acute complicated illness / injury requiring diagnostic workup.   PLAN: Workup initiated from triage.  Labs show leukocytosis of 11.2.  Normal electrolytes.  Normal LFTs.  Troponin negative.  Creatinine normal.  Strep negative.  COVID and RSV negative.  Positive for influenza A.  She is outside treatment window for antivirals.  Chest x-ray reviewed and interpreted by myself and the radiologist and shows no acute abnormality.  She is having posttussive emesis and coughing up streaks of blood but her lungs are completely clear to auscultation and she is otherwise well-appearing with no increased work of breathing or hypoxia here.  Discussed at length supportive care instructions for patient as she states she is having a hard time managing her symptoms at home.  Her biggest concern is blowing her nose and feeling like she cannot breathe when she is lying down.  Will give her Afrin here but instructed  that she should not use this for 4 more than 3 days due to risk of rebound congestion.  Will give ibuprofen here and recommended Tylenol, ibuprofen at home.  Will give Tussionex for cough relief.  Will give Zofran.  Patient tolerating p.o.   MEDICATIONS GIVEN IN ED: Medications  ibuprofen (ADVIL) tablet 800 mg (800 mg Oral Given 07/07/22 2342)  ondansetron (ZOFRAN-ODT) disintegrating tablet 4 mg (4 mg Oral Given 07/07/22 2342)  chlorpheniramine-HYDROcodone (TUSSIONEX) 10-8 MG/5ML suspension 5 mL (5 mLs Oral Given 07/07/22 2342)  oxymetazoline (AFRIN) 0.05 % nasal spray 2 spray (2 sprays Each Nare Given 07/07/22 2342)     ED COURSE:  At this time, I do not  feel there is any life-threatening condition present. I reviewed all nursing notes, vitals, pertinent previous records.  All lab and urine results, EKGs, imaging ordered have been independently reviewed and interpreted by myself.  I reviewed all available radiology reports from any imaging ordered this visit.  Based on my assessment, I feel the patient is safe to be discharged home without further emergent workup and can continue workup as an outpatient as needed. Discussed all findings, treatment plan as well as usual and customary return precautions.  They verbalize understanding and are comfortable with this plan.  Outpatient follow-up has been provided as needed.  All questions have been answered.    CONSULTS:  none   OUTSIDE RECORDS REVIEWED: Reviewed last OB/GYN note on 02/28/2020.       FINAL CLINICAL IMPRESSION(S) / ED DIAGNOSES   Final diagnoses:  Influenza A     Rx / DC Orders   ED Discharge Orders          Ordered    chlorpheniramine-HYDROcodone (TUSSIONEX) 10-8 MG/5ML  Every 12 hours PRN        07/07/22 2335    ondansetron (ZOFRAN-ODT) 4 MG disintegrating tablet  Every 6 hours PRN        07/07/22 2335             Note:  This document was prepared using Dragon voice recognition software and may include  unintentional dictation errors.   Maxi Carreras, Layla Maw, DO 07/07/22 2349

## 2022-07-07 NOTE — Discharge Instructions (Addendum)
You may alternate Tylenol 1000 mg every 6 hours as needed for fever and pain and ibuprofen 800 mg every 6-8 hours as needed for fever and pain. Please rest and drink plenty of fluids. This is a viral illness causing your symptoms. You do not need antibiotics for a virus. You may use over-the-counter nasal saline spray and Afrin nasal saline spray as needed for nasal congestion. Please do not use Afrin for more than 3 days in a row. You may use guaifenesin and dextromethorphan as needed for cough.  You may use lozenges and Chloraseptic spray to help with sore throat.  Warm salt water gargles, honey can also help with sore throat and post nasal drip that leads to cough.  You may use over-the-counter Unisom (doxyalamine) or Benadryl (diphenhydramine) to help with sleep.  Please note that some combination medicines such as DayQuil and NyQuil have multiple medications in them.  Please make sure you look at all labels to ensure that you are not taking too much of any one particular medication.  Symptoms from a virus may take 7-14 days to run its course.  The flu is treated like any other virus with supportive measures as listed above. At this time you are outside the treatment window for Tamiflu or Xofluza. These medications have to be taken within the first 48 hours of symptoms.  Tamiflu has many side effects including nausea, vomiting and diarrhea.

## 2022-07-14 ENCOUNTER — Encounter: Payer: Self-pay | Admitting: Oncology

## 2023-05-26 ENCOUNTER — Emergency Department: Payer: Self-pay

## 2023-05-26 ENCOUNTER — Ambulatory Visit: Payer: Self-pay | Admitting: *Deleted

## 2023-05-26 ENCOUNTER — Ambulatory Visit: Admission: EM | Admit: 2023-05-26 | Discharge: 2023-05-26 | Payer: Self-pay

## 2023-05-26 ENCOUNTER — Other Ambulatory Visit: Payer: Self-pay

## 2023-05-26 ENCOUNTER — Emergency Department
Admission: EM | Admit: 2023-05-26 | Discharge: 2023-05-26 | Disposition: A | Discharge status: Home / Self Care | Payer: Self-pay | Attending: Emergency Medicine | Admitting: Emergency Medicine

## 2023-05-26 DIAGNOSIS — G43009 Migraine without aura, not intractable, without status migrainosus: Secondary | ICD-10-CM | POA: Insufficient documentation

## 2023-05-26 DIAGNOSIS — E119 Type 2 diabetes mellitus without complications: Secondary | ICD-10-CM | POA: Insufficient documentation

## 2023-05-26 DIAGNOSIS — R519 Headache, unspecified: Secondary | ICD-10-CM

## 2023-05-26 DIAGNOSIS — I10 Essential (primary) hypertension: Secondary | ICD-10-CM | POA: Insufficient documentation

## 2023-05-26 DIAGNOSIS — I169 Hypertensive crisis, unspecified: Secondary | ICD-10-CM

## 2023-05-26 DIAGNOSIS — Z79899 Other long term (current) drug therapy: Secondary | ICD-10-CM | POA: Insufficient documentation

## 2023-05-26 DIAGNOSIS — D329 Benign neoplasm of meninges, unspecified: Secondary | ICD-10-CM | POA: Insufficient documentation

## 2023-05-26 DIAGNOSIS — Z794 Long term (current) use of insulin: Secondary | ICD-10-CM | POA: Insufficient documentation

## 2023-05-26 HISTORY — DX: Essential (primary) hypertension: I10

## 2023-05-26 LAB — BASIC METABOLIC PANEL
Anion gap: 10 (ref 5–15)
BUN: 10 mg/dL (ref 6–20)
CO2: 26 mmol/L (ref 22–32)
Calcium: 9 mg/dL (ref 8.9–10.3)
Chloride: 101 mmol/L (ref 98–111)
Creatinine, Ser: 0.65 mg/dL (ref 0.44–1.00)
GFR, Estimated: >60 mL/min (ref >60)
Glucose, Bld: 87 mg/dL (ref 70–99)
Potassium: 3.5 mmol/L (ref 3.5–5.1)
Sodium: 137 mmol/L (ref 135–145)

## 2023-05-26 LAB — CBC
HCT: 44.9 % (ref 36.0–46.0)
Hemoglobin: 15.1 g/dL — ABNORMAL HIGH (ref 12.0–15.0)
MCH: 29 pg (ref 26.0–34.0)
MCHC: 33.6 g/dL (ref 30.0–36.0)
MCV: 86.2 fL (ref 80.0–100.0)
Platelets: 282 10*3/uL (ref 150–400)
RBC: 5.21 MIL/uL — ABNORMAL HIGH (ref 3.87–5.11)
RDW: 14 % (ref 11.5–15.5)
WBC: 9.2 10*3/uL (ref 4.0–10.5)
nRBC: 0 % (ref 0.0–0.2)

## 2023-05-26 LAB — TROPONIN I (HIGH SENSITIVITY)
Troponin I (High Sensitivity): 5 ng/L (ref <18)
Troponin I (High Sensitivity): 4 ng/L (ref <18)

## 2023-05-26 MED ORDER — GADOBUTROL 1 MMOL/ML IV SOLN
10.0000 mL | Freq: Once | INTRAVENOUS | Status: AC | PRN
  Administered 2023-05-26: 10 mL via INTRAVENOUS

## 2023-05-26 MED ORDER — AMLODIPINE BESYLATE 5 MG PO TABS
5.0000 mg | ORAL_TABLET | Freq: Once | ORAL | Status: AC
  Administered 2023-05-26: 5 mg via ORAL
  Filled 2023-05-26: qty 1

## 2023-05-26 MED ORDER — PROCHLORPERAZINE EDISYLATE 10 MG/2ML IJ SOLN
10.0000 mg | Freq: Once | INTRAMUSCULAR | Status: AC
  Administered 2023-05-26: 10 mg via INTRAVENOUS
  Filled 2023-05-26: qty 2

## 2023-05-26 MED ORDER — AMLODIPINE-OLMESARTAN 5-20 MG PO TABS: 1.0000 | ORAL_TABLET | Freq: Every day | ORAL | 0 refills | Status: DC

## 2023-05-26 MED ORDER — IRBESARTAN 75 MG PO TABS
75.0000 mg | ORAL_TABLET | Freq: Every day | ORAL | Status: DC
  Administered 2023-05-26: 75 mg via ORAL
  Filled 2023-05-26: qty 1

## 2023-05-26 MED ORDER — DIPHENHYDRAMINE HCL 50 MG/ML IJ SOLN
25.0000 mg | Freq: Once | INTRAMUSCULAR | Status: AC
  Administered 2023-05-26: 25 mg via INTRAVENOUS
  Filled 2023-05-26: qty 1

## 2023-05-26 NOTE — Telephone Encounter (Signed)
Reason for Disposition  [1] Systolic BP  >= 130 OR Diastolic >= 80 AND [2] not taking BP medications  Answer Assessment - Initial Assessment Questions 1. BLOOD PRESSURE: "What is the blood pressure?" "Did you take at least two measurements 5 minutes apart?"     173/106 this morning.   I've had a headache for 2 days.  I don't normally check it daily.      2. ONSET: "When did you take your blood pressure?"     This morning about 30 min. ago 3. HOW: "How did you take your blood pressure?" (e.g., automatic home BP monitor, visiting nurse)     Home monitor.   I brought it to work with me.   I just changed the batteries.   I thought the headache was probably from my BP.    4. HISTORY: "Do you have a history of high blood pressure?"     No I used to be on BP medications.   I'm not on anything now. 5. MEDICINES: "Are you taking any medicines for blood pressure?" "Have you missed any doses recently?"     No 6. OTHER SYMPTOMS: "Do you have any symptoms?" (e.g., blurred vision, chest pain, difficulty breathing, headache, weakness)     Just the headache 7. PREGNANCY: "Is there any chance you are pregnant?" "When was your last menstrual period?"     Not asked  Protocols used: Blood Pressure - High-A-AH

## 2023-05-26 NOTE — Discharge Instructions (Signed)
Please take medication as prescribed for blood pressure.  Follow-up with neurosurgeon, Dr. Katrinka Blazing in [place].  Return to the ER for any severe headaches worsening symptoms or any urgent changes in your health

## 2023-05-26 NOTE — ED Triage Notes (Addendum)
Patient states that she's had a headache for 2 days. Hx of hypertension. Patient has been keeping a journal of BP today.

## 2023-05-26 NOTE — ED Triage Notes (Signed)
Patient to ED from UC; complaining of headache since yesterday. No relief with Ibuprofen and Aspirin; sent over for hypertension. Patient has been on BP meds but took herself off of them.

## 2023-05-26 NOTE — ED Provider Notes (Signed)
[place] [facility] Provider Note   CSN: [id number] Arrival date & time: 05/26/23  1712     History  Chief Complaint  Patient presents with   Headache    Shannon Escobar is a 47 y.o. female with history of diabetes, hypertension presents to the emergency department for evaluation of headache.  She presented to urgent care earlier today for headache but found to have hypertension.  Blood pressure around 180/109.  She complains of history of headaches, this is more severe than her normal headache.  She has a little bit of nausea and pressure.  She denies any neck pain, fevers, chills.  No weakness, dizziness or lightheadedness.  Headache has been present for 2 days and was gradual in onset.  Patient states that she was taking high blood pressure medication earlier this year but quit taking the medication.  She was on amlodipine with olmesartan  HPI     Home Medications Prior to Admission medications   Medication Sig Start Date End Date Taking? Authorizing Provider  amLODipine-olmesartan (AZOR) 5-20 MG tablet Take 1 tablet by mouth daily. 05/26/23   Evon Slack, PA-C  chlorpheniramine-HYDROcodone (TUSSIONEX) 10-8 MG/5ML Take 5 mLs by mouth every 12 (twelve) hours as needed for cough. 07/07/22   Ward, Layla Maw, DO  ondansetron (ZOFRAN-ODT) 4 MG disintegrating tablet Take 1 tablet (4 mg total) by mouth every 6 (six) hours as needed for nausea or vomiting. 07/07/22   Ward, Layla Maw, DO  OZEMPIC, 1 MG/DOSE, 4 MG/3ML SOPN INJECT 1 DOSE (1MG ) SUBCUTANEOUSLY ONCE A WEEK 01/20/22   Lorre Munroe, NP      Allergies    Penicillins    Review of Systems   Review of Systems  Physical Exam Updated Vital Signs BP (!) 170/99   Pulse (!) 59   Temp 98.1 F (36.7 C) (Oral)   Resp 18   Ht 5\' 7"  (1.702 m)   Wt 113.4 kg   SpO2 98%   BMI 39.16 kg/m  Physical Exam Constitutional:      Appearance: She is well-developed.  HENT:     Head: Normocephalic  and atraumatic.  Eyes:     General: No scleral icterus.    Extraocular Movements: Extraocular movements intact.     Conjunctiva/sclera: Conjunctivae normal.     Pupils: Pupils are equal, round, and reactive to light.     Right eye: Pupil is round and reactive.     Left eye: Pupil is round and reactive.  Cardiovascular:     Rate and Rhythm: Normal rate.     Heart sounds: Normal heart sounds.  Pulmonary:     Effort: Pulmonary effort is normal. No respiratory distress.  Musculoskeletal:        General: Normal range of motion.     Cervical back: Normal range of motion and neck supple.  Skin:    General: Skin is warm.     Findings: No rash.  Neurological:     Mental Status: She is alert and oriented to person, place, and time.     Cranial Nerves: No cranial nerve deficit or facial asymmetry.  Psychiatric:        Mood and Affect: Mood normal. Mood is not anxious.        Speech: Speech normal.        Behavior: Behavior normal. Behavior is not agitated.        Thought Content: Thought content normal.     ED Results /  Procedures / Treatments   Labs (all labs ordered are listed, but only abnormal results are displayed) Labs Reviewed  CBC - Abnormal; Notable for the following components:      Result Value   RBC 5.21 (*)    Hemoglobin 15.1 (*)    All other components within normal limits  BASIC METABOLIC PANEL  POC URINE PREG, ED  TROPONIN I (HIGH SENSITIVITY)  TROPONIN I (HIGH SENSITIVITY)    EKG None  Radiology MR Brain W and Wo Contrast  Result Date: 05/26/2023 CLINICAL DATA:  Headache EXAM: MRI HEAD WITHOUT AND WITH CONTRAST TECHNIQUE: Multiplanar, multiecho pulse sequences of the brain and surrounding structures were obtained without and with intravenous contrast. CONTRAST:  10mL GADAVIST GADOBUTROL 1 MMOL/ML IV SOLN COMPARISON:  None Available. FINDINGS: Brain: No acute infarct, mass effect or extra-axial collection. No chronic microhemorrhage or siderosis. Minimal  multifocal hyperintense T2-weight signal within the white matter. The midline structures are normal. There is a 12 mm meningioma at the paramedian posterior left convexity. No abnormality of the underlying brain. Vascular: Normal flow voids. Skull and upper cervical spine: Normal marrow signal. Sinuses/Orbits: Negative. Other: None. IMPRESSION: 1. No acute intracranial abnormality. 2. A 12 mm meningioma at the paramedian posterior left convexity. No abnormality of the underlying brain. Electronically Signed   By: Deatra Robinson M.D.   On: 05/26/2023 22:22   CT Head Wo Contrast  Result Date: 05/26/2023 CLINICAL DATA:  Headache for 2 days EXAM: CT HEAD WITHOUT CONTRAST TECHNIQUE: Contiguous axial images were obtained from the base of the skull through the vertex without intravenous contrast. RADIATION DOSE REDUCTION: This exam was performed according to the departmental dose-optimization program which includes automated exposure control, adjustment of the mA and/or kV according to patient size and/or use of iterative reconstruction technique. COMPARISON:  04/09/2007 FINDINGS: Brain: Hyperdense lesion along the left medial occipital lobe, which is favored to be extra-axial, measuring up to 10 x 12 x 10 mm (AP x TR x CC) (series 2, image 17 and series 4, image 61). Possible mild hypodensity in the adjacent left occipital lobe, which could represent edema. Possible additional calcified lesion along the vertex posterior right frontal convexity (series 5, image 24). No evidence of acute infarct, hemorrhage, or midline shift. No evidence of acute infarction, hemorrhage, mass, mass effect, or midline shift. No hydrocephalus or extra-axial fluid collection. Vascular: No hyperdense vessel. Skull: Negative for fracture or focal lesion. Sinuses/Orbits: No acute finding. Other: The mastoid air cells are well aerated. IMPRESSION: Hyperdense lesion along the left medial occipital lobe, which is favored to be extra-axial,  measuring up to 12 mm, with possible mild hypodensity in the adjacent left occipital lobe, which could represent edema. Possible additional calcified lesion along the vertex posterior right frontal convexity. These are favored to represent meningiomas; MRI of the brain with and without contrast is recommended for further evaluation. Electronically Signed   By: Wiliam Ke M.D.   On: 05/26/2023 19:34   DG Chest 2 View  Result Date: 05/26/2023 CLINICAL DATA:  Hypertension and headache EXAM: CHEST - 2 VIEW COMPARISON:  Chest radiograph dated 07/07/2022 FINDINGS: Unchanged asymmetric elevation of the right hemidiaphragm. Normal lung volumes. Rounded radiodensity seen on lateral view projecting over the anterior aspect of approximately T8-9, likely artifactual related to superimposed osseous structures. No pleural effusion or pneumothorax. The heart size and mediastinal contours are within normal limits. No acute osseous abnormality. Right upper quadrant surgical clips. IMPRESSION: No active cardiopulmonary disease. Electronically Signed   By:  Agustin Cree M.D.   On: 05/26/2023 18:57    Procedures Procedures    Medications Ordered in ED Medications  irbesartan (AVAPRO) tablet 75 mg (75 mg Oral Given 05/26/23 1924)  amLODipine (NORVASC) tablet 5 mg (5 mg Oral Given 05/26/23 1924)  prochlorperazine (COMPAZINE) injection 10 mg (10 mg Intravenous Given 05/26/23 1926)  diphenhydrAMINE (BENADRYL) injection 25 mg (25 mg Intravenous Given 05/26/23 1926)  gadobutrol (GADAVIST) 1 MMOL/ML injection 10 mL (10 mLs Intravenous Contrast Given 05/26/23 2130)    ED Course/ Medical Decision Making/ A&P                                 Medical Decision Making Amount and/or Complexity of Data Reviewed Radiology: ordered.  Risk Prescription drug management.  47 year old female presents with migraine headache symptoms.  She had no neurological deficits but noted to be hypertensive.  Has a history of hypertension but is  been off her blood pressure medication for several months.  Patient was restarted on her blood pressure medication, blood pressure significantly improved.  CT scan of the head showed some edema and recommended MRI.  MRI showed no acute changes, swelling/edema or acute intracranial process.  There did appear to be a 12 mm meningioma.  Patient's headache resolved with IV fluids, Benadryl, Compazine.  She appears well, blood pressure stable.  She was restarted on her blood pressure medication and will follow-up with PCP as well as neurosurgery.  She understands signs and symptoms return to the ER for.   Final Clinical Impression(s) / ED Diagnoses Final diagnoses:  Hypertension, unspecified type  Acute nonintractable headache, unspecified headache type  Migraine without aura and without status migrainosus, not intractable  Meningioma [facility])    Rx / DC Orders ED Discharge Orders          Ordered    amLODipine-olmesartan (AZOR) 5-20 MG tablet  Daily        05/26/23 2259              Ronnette Juniper 05/26/23 2307    Dionne Bucy, MD 05/30/23 1104

## 2023-05-26 NOTE — ED Notes (Signed)
Patient is being discharged from the Urgent Care and sent to the Emergency Department via personal vehicle . Per Harl Bowie, NP, patient is in need of higher level of care due to Hypertension, dizziness. Patient is aware and verbalizes understanding of plan of care.  Vitals:   05/26/23 1607  BP: (!) 189/124  Pulse: 67  Resp: 19  Temp: 98.7 F (37.1 C)  SpO2: 98%

## 2023-05-26 NOTE — Discharge Instructions (Signed)
Go immediately to Er for further evaluation and management, do not eat or drink anything until evaluated by ER provider.

## 2023-05-26 NOTE — ED Provider Notes (Signed)
[facility]    CSN: [id number] Arrival date & time: 05/26/23  1453      History   Chief Complaint Chief Complaint  Patient presents with   Hypertension   Headache    HPI Shannon Escobar is a 47 y.o. female.   47 year old female,  Shannon Escobar, presents to urgent care for evaluation of high blood pressure, headache 10/10 "worst HA of life", blurry vision x 2 days.  Patient states she has not been taking her blood pressure medication as she has been out of her meds.   The history is provided by the patient. No language interpreter was used.    Past Medical History:  Diagnosis Date   Anemia    Hypertension     Patient Active Problem List   Diagnosis Date Noted   Hypertensive crisis 05/26/2023   Worst headache of life 05/26/2023   Primary hypertension 10/22/2021   Mixed hyperlipidemia 12/11/2020   Class 2 obesity due to excess calories with body mass index (BMI) of 38.0 to 38.9 in adult 12/10/2020   DM (diabetes mellitus), type 2 (HCC) 04/08/2019   Polycythemia 10/12/2018    Past Surgical History:  Procedure Laterality Date   ABDOMINAL SURGERY     BREAST CYST ASPIRATION Right 2011   abcess drained   CESAREAN SECTION     x2   CHOLECYSTECTOMY     TUBAL LIGATION      OB History   No obstetric history on file.      Home Medications    Prior to Admission medications   Medication Sig Start Date End Date Taking? Authorizing Provider  amLODipine-olmesartan (AZOR) 5-20 MG tablet Take 1 tablet by mouth daily. 10/28/21   Lorre Munroe, NP  chlorpheniramine-HYDROcodone (TUSSIONEX) 10-8 MG/5ML Take 5 mLs by mouth every 12 (twelve) hours as needed for cough. 07/07/22   Ward, Layla Maw, DO  ondansetron (ZOFRAN-ODT) 4 MG disintegrating tablet Take 1 tablet (4 mg total) by mouth every 6 (six) hours as needed for nausea or vomiting. 07/07/22   Ward, Layla Maw, DO  OZEMPIC, 1 MG/DOSE, 4 MG/3ML SOPN INJECT 1 DOSE (1MG ) SUBCUTANEOUSLY ONCE A WEEK 01/20/22   Baity,  Salvadore Oxford, NP    Family History Family History  Problem Relation Age of Onset   Diabetes Mother    Colon polyps Father     Social History Social History   Tobacco Use   Smoking status: Never   Smokeless tobacco: Never  Vaping Use   Vaping status: Never Used  Substance Use Topics   Alcohol use: No    Comment: occassional in social   Drug use: Never     Allergies   Penicillins   Review of Systems Review of Systems  Eyes:  Positive for visual disturbance.  Neurological:  Positive for headaches.  All other systems reviewed and are negative.    Physical Exam Triage Vital Signs ED Triage Vitals  Encounter Vitals Group     BP 05/26/23 1607 (!) 189/124     Systolic BP Percentile --      Diastolic BP Percentile --      Pulse Rate 05/26/23 1607 67     Resp 05/26/23 1607 19     Temp 05/26/23 1607 98.7 F (37.1 C)     Temp Source 05/26/23 1607 Oral     SpO2 05/26/23 1607 98 %     Weight --      Height --      Head Circumference --  Peak Flow --      Pain Score 05/26/23 1606 10     Pain Loc --      Pain Education --      Exclude from Growth Chart --    No data found.  Updated Vital Signs BP (!) 189/124 (BP Location: Right Arm)   Pulse 67   Temp 98.7 F (37.1 C) (Oral)   Resp 19   SpO2 98%   Visual Acuity Right Eye Distance:   Left Eye Distance:   Bilateral Distance:    Right Eye Near:   Left Eye Near:    Bilateral Near:     Physical Exam Vitals and nursing note reviewed.  Constitutional:      Appearance: She is well-developed and well-groomed.  Eyes:     Pupils: Pupils are equal, round, and reactive to light.  Cardiovascular:     Rate and Rhythm: Normal rate.  Pulmonary:     Effort: Pulmonary effort is normal.  Neurological:     General: No focal deficit present.     Mental Status: She is alert and oriented to person, place, and time.     GCS: GCS eye subscore is 4. GCS verbal subscore is 5. GCS motor subscore is 6.  Psychiatric:         Attention and Perception: Attention normal.        Mood and Affect: Mood normal.        Speech: Speech normal.        Behavior: Behavior normal. Behavior is cooperative.      UC Treatments / Results  Labs (all labs ordered are listed, but only abnormal results are displayed) Labs Reviewed - No data to display  EKG   Radiology No results found.  Procedures Procedures (including critical care time)  Medications Ordered in UC Medications - No data to display  Initial Impression / Assessment and Plan / UC Course  I have reviewed the triage vital signs and the nursing notes.  Pertinent labs & imaging results that were available during my care of the patient were reviewed by me and considered in my medical decision making (see chart for details).  Clinical Course as of 05/26/23 1636  Tue May 26, 2023  1625 Discussed with patient that she will need to go to emergency room for further evaluation and management of her blood pressure, headache"worst HA of life" and blurry vision, she states her son will drive her [JD]    Clinical Course User Index [JD] Shannon Escobar, Para March, NP   Discussed exam findings plan of care with patient patient verbalized understanding to this provider will go to [facility] ER for further evaluation, son driving.  Ddx: Hypertensive crisis, worst headache of life Final Clinical Impressions(s) / UC Diagnoses   Final diagnoses:  Hypertensive crisis  Worst headache of life     Discharge Instructions      Go immediately to Er for further evaluation and management, do not eat or drink anything until evaluated by ER provider.    ED Prescriptions   None    PDMP not reviewed this encounter.   Clancy Gourd, NP 05/26/23 [phone]

## 2023-05-26 NOTE — ED Notes (Addendum)
Patient placed on blood pressure cuff and pulse ox.  Family at bedside.

## 2023-05-26 NOTE — Telephone Encounter (Signed)
  Chief Complaint: Headache for 2 days.   BP elevated 173/106 this morning. Symptoms: Just the headache.   Been without insurance so not been on BP medications for a while.   Now has insurance. Frequency: For the last 2 days. Pertinent Negatives: Patient denies any other symptoms Disposition: [] ED /[] Urgent Care (no appt availability in office) / [x] Appointment(In office/virtual)/ []  [facility]/ [] Home Care/ [] Refused Recommended Disposition /[]  Mobile Bus/ []  Follow-up with PCP Additional Notes: Appt made with Nicki Reaper, NP 06/04/2023 at 8:40.    ED precautions given.

## 2023-05-27 ENCOUNTER — Telehealth: Payer: Self-pay | Admitting: Emergency Medicine

## 2023-05-27 NOTE — Telephone Encounter (Signed)
Evaluated in the emergency department and diagnosed with a meningioma.  Was told she was getting neurosurgery follow-up however instead received orthopedic follow-up.  Given information to follow-up with neurosurgery Dr. Myer Haff in the clinic number and information.  This was provided to the unit secretary who gave the information to the patient.

## 2023-05-28 ENCOUNTER — Telehealth: Payer: Self-pay

## 2023-05-28 NOTE — Transitions of Care (Post Inpatient/ED Visit) (Signed)
   05/28/2023  Name: Shannon Escobar MRN: [id number] DOB: 07-10-1975  Today's TOC FU Call Status: Today's TOC FU Call Status:: Successful TOC FU Call Completed TOC FU Call Complete Date: 05/28/23 Patient's Name and Date of Birth confirmed.  Transition Care Management Follow-up Telephone Call Date of Discharge: 05/26/23 Discharge Facility: [facility]) Type of Discharge: Emergency Department Reason for ED Visit:  (hypertension) How have you been since you were released from the hospital?: Better Any questions or concerns?: No  Items Reviewed: Did you receive and understand the discharge instructions provided?: Yes Medications obtained,verified, and reconciled?: Yes (Medications Reviewed) Any new allergies since your discharge?: No Dietary orders reviewed?: NA Do you have support at home?: Yes People in Home: child(ren), adult  Medications Reviewed Today: Medications Reviewed Today     Reviewed by Katai Marsico, Jordan Hawks, CMA (Certified Medical Assistant) on 05/28/23 at 1625  Med List Status: <None>   Medication Order Taking? Sig Documenting Provider Last Dose Status Informant  amLODipine-olmesartan (AZOR) 5-20 MG tablet [id number] Yes Take 1 tablet by mouth daily. Evon Slack, PA-C Taking Active   chlorpheniramine-HYDROcodone (TUSSIONEX) 10-8 MG/5ML [id number]  Take 5 mLs by mouth every 12 (twelve) hours as needed for cough. Ward, Layla Maw, DO  Consider Medication Status and Discontinue (Completed Course)   ondansetron (ZOFRAN-ODT) 4 MG disintegrating tablet [id number]  Take 1 tablet (4 mg total) by mouth every 6 (six) hours as needed for nausea or vomiting. Ward, Layla Maw, DO  Consider Medication Status and Discontinue (Completed Course)   OZEMPIC, 1 MG/DOSE, 4 MG/3ML SOPN [id number] Yes INJECT 1 DOSE (1MG ) SUBCUTANEOUSLY ONCE A WEEK Baity, Salvadore Oxford, NP Taking Active             Home Care and Equipment/Supplies: Were Home Health Services Ordered?:  NA Any new equipment or medical supplies ordered?: NA  Functional Questionnaire: Do you need assistance with bathing/showering or dressing?: No Do you need assistance with meal preparation?: No Do you need assistance with eating?: No Do you have difficulty maintaining continence: No Do you need assistance with getting out of bed/getting out of a chair/moving?: No Do you have difficulty managing or taking your medications?: No  Follow up appointments reviewed: PCP Follow-up appointment confirmed?: Yes Date of PCP follow-up appointment?: 06/04/23 Follow-up Provider: [facility] Follow-up appointment confirmed?: Yes Date of Specialist follow-up appointment?: 06/03/23 Follow-Up Specialty Provider:: Ernestine Mcmurray new patient appt Do you need transportation to your follow-up appointment?: No Do you understand care options if your condition(s) worsen?: Yes-patient verbalized understanding    Abby Dillin Lofgren, CMA  [facility] AWV Team Direct Dial: [phone]

## 2023-05-29 NOTE — Telephone Encounter (Signed)
Will discuss at upcoming appointment

## 2023-06-03 ENCOUNTER — Ambulatory Visit: Payer: Self-pay | Admitting: Neurosurgery

## 2023-06-04 ENCOUNTER — Ambulatory Visit: Payer: Self-pay | Admitting: Internal Medicine

## 2023-06-23 NOTE — Progress Notes (Signed)
 Referring Physician:  Antonette Angeline ORN, NP [address] [place],  [place] [address]  Primary Physician:  Antonette Angeline ORN, NP  History of Present Illness: 06/29/2023 Shannon Escobar is here today with a chief complaint of new diagnosis of meningioma.  She has a history of headaches and migraines.  She recently presented to the emergency department on 05/26/2023 with a significant headache.  She also had high blood pressure at that time.  She was found to have a small dural based mass next to the sinus measuring approximately 1.2 cm.  She was referred to neurosurgery for evaluation.  She has not had any new headaches.  No new seizures.  No vision loss.  No other neurologic symptoms.  She does not have any history of intracranial tumors.  No history of peripheral nerve sheath tumors.  Review of Systems:  A 10 point review of systems is negative, except for the pertinent positives and negatives detailed in the HPI.  Past Medical History: Past Medical History:  Diagnosis Date   Anemia    Hypertension     Past Surgical History: Past Surgical History:  Procedure Laterality Date   ABDOMINAL SURGERY     BREAST CYST ASPIRATION Right 2011   abcess drained   CESAREAN SECTION     x2   CHOLECYSTECTOMY     TUBAL LIGATION      Allergies: Allergies as of 06/29/2023 - Review Complete 06/29/2023  Allergen Reaction Noted   Penicillins  09/12/2015    Medications:  Current Outpatient Medications:    amLODipine -olmesartan  (AZOR ) 5-20 MG tablet, Take 2 tablets by mouth daily., Disp: 90 tablet, Rfl: 0   tirzepatide  (MOUNJARO ) 2.5 MG/0.5ML Pen, Inject 2.5 mg into the skin once a week., Disp: 2 mL, Rfl: 0  Social History: Social History   Tobacco Use   Smoking status: Never   Smokeless tobacco: Never  Vaping Use   Vaping status: Never Used  Substance Use Topics   Alcohol use: No    Comment: occassional in social   Drug use: Never    Family Medical History: Family History  Problem  Relation Age of Onset   Diabetes Mother    Colon polyps Father     Physical Examination: Vitals:   06/29/23 1458  BP: 132/82    General: Patient is in no apparent distress. Attention to examination is appropriate.  Neck:   Supple.  Full range of motion.  Respiratory: Patient is breathing without any difficulty.   NEUROLOGICAL:     Awake, alert, oriented to person, place, and time.  Speech is clear and fluent.   Cranial Nerves: Pupils equal round and reactive to light.  Visual fields are intact to confrontation bilaterally.  No evidence of visual field cut.  Facial tone is symmetric.  Facial sensation is symmetric. Shoulder shrug is symmetric. Tongue protrusion is midline.    Strength: No gross motor deficits noted  No loss of sensation in the bilateral hands     No evidence of dysmetria noted.  Gait is normal.    Imaging: Narrative & Impression  CLINICAL DATA:  Headache   EXAM: MRI HEAD WITHOUT AND WITH CONTRAST   TECHNIQUE: Multiplanar, multiecho pulse sequences of the brain and surrounding structures were obtained without and with intravenous contrast.   CONTRAST:  10mL GADAVIST  GADOBUTROL  1 MMOL/ML IV SOLN   COMPARISON:  None Available.   FINDINGS: Brain: No acute infarct, mass effect or extra-axial collection. No chronic microhemorrhage or siderosis. Minimal multifocal  hyperintense T2-weight signal within the white matter. The midline structures are normal. There is a 12 mm meningioma at the paramedian posterior left convexity. No abnormality of the underlying brain.   Vascular: Normal flow voids.   Skull and upper cervical spine: Normal marrow signal.   Sinuses/Orbits: Negative.   Other: None.   IMPRESSION: 1. No acute intracranial abnormality. 2. A 12 mm meningioma at the paramedian posterior left convexity. No abnormality of the underlying brain.     Electronically Signed   By: Franky Stanford M.D.   On: 05/26/2023 22:22    I have  personally reviewed the images and agree with the above interpretation.  Medical Decision Making/Assessment and Plan: [name] is a pleasant 47 y.o. 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  She is scanned with a CT scan for acute onset of headaches.  She has been history of a migraine aura.  She was found to have a left-sided dural based mass.  Read was consistent with likely meningioma.  She is not having any symptoms regarding this lesion.  No visual field changes.  No weakness numbness or tingling.  She does not have any history of previous intracranial lesions.  No history of peripheral nerve sheath tumors.  This point she has a small asymptomatic mass that will likely require some interval imaging follow-up.  Would like to make a referral to our neuro-oncology specialist, Dr. Buckley , for management.  Thank you for involving me in the care of this patient.   Spent a total of 30 minutes evaluating her chart, reviewing her images, going through her images and findings.  Physical exam and in person evaluation.  And coordination of her care.   Penne MICAEL Sharps MD/MSCR Neurosurgery

## 2023-06-25 ENCOUNTER — Encounter: Payer: Self-pay | Admitting: Oncology

## 2023-06-25 ENCOUNTER — Ambulatory Visit (INDEPENDENT_AMBULATORY_CARE_PROVIDER_SITE_OTHER): Payer: BC Managed Care – PPO | Admitting: Internal Medicine

## 2023-06-25 ENCOUNTER — Encounter: Payer: Self-pay | Admitting: Internal Medicine

## 2023-06-25 VITALS — BP 148/92 | Ht 67.0 in | Wt 257.0 lb

## 2023-06-25 DIAGNOSIS — E1169 Type 2 diabetes mellitus with other specified complication: Secondary | ICD-10-CM

## 2023-06-25 DIAGNOSIS — E66813 Obesity, class 3: Secondary | ICD-10-CM | POA: Diagnosis not present

## 2023-06-25 DIAGNOSIS — Z6841 Body Mass Index (BMI) 40.0 and over, adult: Secondary | ICD-10-CM

## 2023-06-25 DIAGNOSIS — E119 Type 2 diabetes mellitus without complications: Secondary | ICD-10-CM

## 2023-06-25 DIAGNOSIS — E785 Hyperlipidemia, unspecified: Secondary | ICD-10-CM

## 2023-06-25 DIAGNOSIS — D329 Benign neoplasm of meninges, unspecified: Secondary | ICD-10-CM | POA: Insufficient documentation

## 2023-06-25 DIAGNOSIS — I10 Essential (primary) hypertension: Secondary | ICD-10-CM

## 2023-06-25 LAB — POCT GLYCOSYLATED HEMOGLOBIN (HGB A1C): HbA1c, POC (controlled diabetic range): 6.8 % (ref 0.0–7.0)

## 2023-06-25 MED ORDER — AMLODIPINE-OLMESARTAN 5-20 MG PO TABS: 2.0000 | ORAL_TABLET | Freq: Every day | ORAL | 0 refills | Status: DC

## 2023-06-25 MED ORDER — TIRZEPATIDE 2.5 MG/0.5ML ~~LOC~~ SOAJ: 2.5000 mg | SUBCUTANEOUS | 0 refills | Status: DC

## 2023-06-25 NOTE — Progress Notes (Signed)
 Subjective:    Patient ID: Shannon Escobar, female    DOB: September 28, 1975, 48 y.o.   MRN: [id number]  HPI  Patient presents to clinic today for follow-up of chronic conditions.  DM2: Her last A1c was 6 0.6 %, 10/2021.  She is not taking any diabetic medication at this time.  She does not check her sugars.  She checks her feet routinely.  Her last eye exam was < 1 years ago, [employer].  Flu never.  Pneumovax never.  COVID x 2.  HLD: Her last LDL was 115, triglycerides 143, 10/2021.  She is not taking any oral cholesterol-lowering medication at this time.  She does not consume a low-fat diet.  HTN: Her BP today is 152/90.  She is taking amlodipine -olmesartan  as prescribed.  She had a recent ER visit 12/3 for the same.  She has been out of her blood pressure medication due to lack of insurance.  She was complaining of a headache so CT head was done.  There was concern for some mild edema and MRI was recommended.  MRI did not show any evidence of edema but did incidentally show a 12 mm meningioma.  She was restarted on her blood pressure medication advised to follow-up with her PCP and neurosurgery.  Review of Systems  Past Medical History:  Diagnosis Date   Anemia    Hypertension     Current Outpatient Medications  Medication Sig Dispense Refill   amLODipine -olmesartan  (AZOR ) 5-20 MG tablet Take 1 tablet by mouth daily. 90 tablet 0   chlorpheniramine-HYDROcodone (TUSSIONEX) 10-8 MG/5ML Take 5 mLs by mouth every 12 (twelve) hours as needed for cough. 100 mL 0   ondansetron  (ZOFRAN -ODT) 4 MG disintegrating tablet Take 1 tablet (4 mg total) by mouth every 6 (six) hours as needed for nausea or vomiting. 20 tablet 0   OZEMPIC , 1 MG/DOSE, 4 MG/3ML SOPN INJECT 1 DOSE (1MG ) SUBCUTANEOUSLY ONCE A WEEK 9 mL 0   No current facility-administered medications for this visit.    Allergies  Allergen Reactions   Penicillins     Family History  Problem Relation Age of Onset   Diabetes Mother     Colon polyps Father     Social History   Socioeconomic History   Marital status: Significant Other    Spouse name: Not on file   Number of children: 2   Years of education: Not on file   Highest education level: Not on file  Occupational History   Not on file  Tobacco Use   Smoking status: Never   Smokeless tobacco: Never  Vaping Use   Vaping status: Never Used  Substance and Sexual Activity   Alcohol use: No    Comment: occassional in social   Drug use: Never   Sexual activity: Yes    Partners: Male    Birth control/protection: I.U.D.  Other Topics Concern   Not on file  Social History Narrative   Not on file   Social Drivers of Health   Financial Resource Strain: Low Risk  (04/08/2019)   Overall Financial Resource Strain (CARDIA)    Difficulty of Paying Living Expenses: Not hard at all  Food Insecurity: No Food Insecurity (04/08/2019)   Hunger Vital Sign    Worried About Running Out of Food in the Last Year: Never true    Ran Out of Food in the Last Year: Never true  Transportation Needs: No Transportation Needs (04/08/2019)   PRAPARE - Transportation    Lack of  Transportation (Medical): No    Lack of Transportation (Non-Medical): No  Physical Activity: Sufficiently Active (04/08/2019)   Exercise Vital Sign    Days of Exercise per Week: 5 days    Minutes of Exercise per Session: 30 min  Stress: No Stress Concern Present (04/08/2019)   [employer] of Occupational Health - Occupational Stress Questionnaire    Feeling of Stress : Only a little  Social Connections: Moderately Isolated (04/08/2019)   Social Connection and Isolation Panel [NHANES]    Frequency of Communication with Friends and Family: More than three times a week    Frequency of Social Gatherings with Friends and Family: Never    Attends Religious Services: More than 4 times per year    Active Member of [employer] or Organizations: No    Attends Banker Meetings: Never    Marital  Status: Never married  Intimate Partner Violence: Not At Risk (04/08/2019)   Humiliation, Afraid, Rape, and Kick questionnaire    Fear of Current or Ex-Partner: No    Emotionally Abused: No    Physically Abused: No    Sexually Abused: No     Constitutional:  Denies fever, malaise, fatigue, headache, or abrupt weight changes.  Respiratory: Denies difficulty breathing, shortness of breath, cough or sputum production.   Cardiovascular: Denies chest pain, chest tightness, palpitations or swelling in the hands or feet.  Musculoskeletal: Denies decrease in range of motion, difficulty with gait, muscle pain or joint pain and swelling.  Skin: Denies redness, rashes, lesions or ulcercations.  Neurological: Denies dizziness, difficulty with memory, difficulty with speech or problems with balance and coordination.    No other specific complaints in a complete review of systems (except as listed in HPI above).     Objective:   Physical Exam   BP (!) 152/90 (BP Location: Left Arm, Patient Position: Sitting, Cuff Size: Large)   Ht 5' 7 (1.702 m)   Wt 257 lb (116.6 kg)   BMI 40.25 kg/m    Wt Readings from Last 3 Encounters:  05/26/23 250 lb (113.4 kg)  07/07/22 245 lb (111.1 kg)  10/28/21 254 lb (115.2 kg)    General: Appears her stated age, obese, in NAD. Skin: Warm, dry and intact.  HEENT: Head: normal shape and size; Eyes: sclera white, no icterus, conjunctiva pink, PERRLA and EOMs intact;  Cardiovascular: Normal rate and rhythm. S1,S2 noted.  No murmur, rubs or gallops noted. No JVD or BLE edema.  Pulmonary/Chest: Normal effort and positive vesicular breath sounds. No respiratory distress. No wheezes, rales or ronchi noted.  Musculoskeletal: No difficulty with gait.  Neurological: Alert and oriented.    BMET    Component Value Date/Time   NA 137 05/26/2023 1736   K 3.5 05/26/2023 1736   CL 101 05/26/2023 1736   CO2 26 05/26/2023 1736   GLUCOSE 87 05/26/2023 1736   BUN 10  05/26/2023 1736   CREATININE 0.65 05/26/2023 1736   CREATININE 0.64 10/22/2021 1104   CALCIUM  9.0 05/26/2023 1736   GFRNONAA >60 05/26/2023 1736   GFRNONAA 106 07/19/2019 0846   GFRAA 123 07/19/2019 0846    Lipid Panel     Component Value Date/Time   CHOL 191 10/22/2021 1104   TRIG 143 10/22/2021 1104   HDL 50 10/22/2021 1104   CHOLHDL 3.8 10/22/2021 1104   LDLCALC 115 (H) 10/22/2021 1104    CBC    Component Value Date/Time   WBC 9.2 05/26/2023 1736   RBC 5.21 (H) 05/26/2023 1736  HGB 15.1 (H) 05/26/2023 1736   HCT 44.9 05/26/2023 1736   PLT 282 05/26/2023 1736   MCV 86.2 05/26/2023 1736   MCH 29.0 05/26/2023 1736   MCHC 33.6 05/26/2023 1736   RDW 14.0 05/26/2023 1736   LYMPHSABS 2.1 07/07/2022 2008   MONOABS 0.5 07/07/2022 2008   EOSABS 0.1 07/07/2022 2008   BASOSABS 0.0 07/07/2022 2008    Hgb A1C Lab Results  Component Value Date   HGBA1C 6.6 (H) 10/22/2021           Assessment & Plan:    RTC in 2 weeks, follow-up HTN, 6 months for annual exam Angeline Laura, NP

## 2023-06-25 NOTE — Assessment & Plan Note (Signed)
 POCT A1c 6.8% We will check urine microalbumin Reinforced low-carb diet and exercise for weight loss We will start Mounjaro  2.5 mg weekly x 4 weeks then increase to 5 mg weekly Encourage routine eye exams, will request copy of last eye exam Encourage routine foot exams She declines flu, Pneumovax and COVID vaccines

## 2023-06-25 NOTE — Assessment & Plan Note (Signed)
 She has a follow-up with neurosurgery scheduled for further evaluation

## 2023-06-25 NOTE — Progress Notes (Signed)
 PA started   Shannon Escobar (Key: [id number]) Rx #: [id number] Need Help? Call us  at [phone] Status New (Not sent to plan) Drug Mounjaro  2.5MG /0.5ML auto-injectors ePA cloud logo Form [employer] [place]  Commercial Electronic PA Form [phone] NCPDP) Original Claim Info 61

## 2023-06-25 NOTE — Assessment & Plan Note (Signed)
 Lipid profile today Encouraged her to consume a low-fat diet and exercise for weight loss

## 2023-06-25 NOTE — Assessment & Plan Note (Signed)
 Improved but still not at goal We will increase amlodipine-olmesartan to 10-40 daily Reinforced DASH diet and exercise for weight loss Kidney function reviewed

## 2023-06-25 NOTE — Patient Instructions (Signed)

## 2023-06-25 NOTE — Assessment & Plan Note (Signed)
 Encourage diet and exercise for weight loss

## 2023-06-26 LAB — LIPID PANEL
Cholesterol: 162 mg/dL (ref <200)
HDL: 49 mg/dL — ABNORMAL LOW (ref >=50)
LDL Cholesterol (Calc): 91 mg/dL
Non-HDL Cholesterol (Calc): 113 mg/dL (ref <130)
Total CHOL/HDL Ratio: 3.3 (calc) (ref <5.0)
Triglycerides: 121 mg/dL (ref <150)

## 2023-06-26 LAB — MICROALBUMIN / CREATININE URINE RATIO
Creatinine, Urine: 265 mg/dL (ref 20–275)
Microalb Creat Ratio: 5 mg/g{creat} (ref <30)
Microalb, Ur: 1.3 mg/dL

## 2023-06-29 ENCOUNTER — Ambulatory Visit (INDEPENDENT_AMBULATORY_CARE_PROVIDER_SITE_OTHER): Payer: BC Managed Care – PPO | Admitting: Neurosurgery

## 2023-06-29 VITALS — BP 132/82 | Ht 67.0 in | Wt 257.0 lb

## 2023-06-29 DIAGNOSIS — D329 Benign neoplasm of meninges, unspecified: Secondary | ICD-10-CM | POA: Diagnosis not present

## 2023-07-10 ENCOUNTER — Encounter: Payer: Self-pay | Admitting: Internal Medicine

## 2023-07-10 ENCOUNTER — Ambulatory Visit (INDEPENDENT_AMBULATORY_CARE_PROVIDER_SITE_OTHER): Payer: BC Managed Care – PPO | Admitting: Internal Medicine

## 2023-07-10 VITALS — BP 130/84 | Ht 67.0 in | Wt 260.8 lb

## 2023-07-10 DIAGNOSIS — J3089 Other allergic rhinitis: Secondary | ICD-10-CM

## 2023-07-10 DIAGNOSIS — E66813 Obesity, class 3: Secondary | ICD-10-CM

## 2023-07-10 DIAGNOSIS — I10 Essential (primary) hypertension: Secondary | ICD-10-CM

## 2023-07-10 DIAGNOSIS — Z6841 Body Mass Index (BMI) 40.0 and over, adult: Secondary | ICD-10-CM

## 2023-07-10 MED ORDER — AMLODIPINE-OLMESARTAN 10-40 MG PO TABS: 1.0000 | ORAL_TABLET | Freq: Every day | ORAL | 1 refills | Status: DC

## 2023-07-10 MED ORDER — TIRZEPATIDE 5 MG/0.5ML ~~LOC~~ SOAJ: 5.0000 mg | SUBCUTANEOUS | 1 refills | Status: DC

## 2023-07-10 NOTE — Assessment & Plan Note (Signed)
Encourage diet and exercise for weight loss 

## 2023-07-10 NOTE — Assessment & Plan Note (Signed)
Controlled on amlodipine-olmesartan, refilled today Reinforced DASH diet and exercise for weight loss 

## 2023-07-10 NOTE — Progress Notes (Signed)
Subjective:    Patient ID: Shannon Escobar, female    DOB: 1976/05/18, 48 y.o.   MRN: [id number]  HPI  Patient presents to clinic today for 2-week follow-up of HTN.  At her last visit, her amlodipine-olmesartan was increased to 10-40 mg daily.  She has been taking the medication as prescribed.  Her BP today is 130/84.  ECG from 05/2023 reviewed.  She also reports nasal congestion, postnasal drip and cough.  She reports this started 1 week ago.  She is blowing clear mucus out of her nose and the cough is productive of clear mucus.  She denies headache, runny nose, ear pain, sore throat, shortness of breath.  She denies fever, chills, body aches, nausea, vomiting or diarrhea.  She has tried antihistamine nasal spray OTC with some relief of symptoms.  Review of Systems  Past Medical History:  Diagnosis Date   Anemia    Hypertension     Current Outpatient Medications  Medication Sig Dispense Refill   amLODipine-olmesartan (AZOR) 5-20 MG tablet Take 2 tablets by mouth daily. 90 tablet 0   tirzepatide (MOUNJARO) 2.5 MG/0.5ML Pen Inject 2.5 mg into the skin once a week. 2 mL 0   No current facility-administered medications for this visit.    Allergies  Allergen Reactions   Penicillins     Family History  Problem Relation Age of Onset   Diabetes Mother    Colon polyps Father     Social History   Socioeconomic History   Marital status: Significant Other    Spouse name: Not on file   Number of children: 2   Years of education: Not on file   Highest education level: Not on file  Occupational History   Not on file  Tobacco Use   Smoking status: Never   Smokeless tobacco: Never  Vaping Use   Vaping status: Never Used  Substance and Sexual Activity   Alcohol use: No    Comment: occassional in social   Drug use: Never   Sexual activity: Yes    Partners: Male    Birth control/protection: I.U.D.  Other Topics Concern   Not on file  Social History Narrative   Not on file    Social Drivers of Health   Financial Resource Strain: Low Risk  (04/08/2019)   Overall Financial Resource Strain (CARDIA)    Difficulty of Paying Living Expenses: Not hard at all  Food Insecurity: No Food Insecurity (04/08/2019)   Hunger Vital Sign    Worried About Running Out of Food in the Last Year: Never true    Ran Out of Food in the Last Year: Never true  Transportation Needs: No Transportation Needs (04/08/2019)   PRAPARE - Administrator, Civil Service (Medical): No    Lack of Transportation (Non-Medical): No  Physical Activity: Sufficiently Active (04/08/2019)   Exercise Vital Sign    Days of Exercise per Week: 5 days    Minutes of Exercise per Session: 30 min  Stress: No Stress Concern Present (04/08/2019)   [employer] of Occupational Health - Occupational Stress Questionnaire    Feeling of Stress : Only a little  Social Connections: Moderately Isolated (04/08/2019)   Social Connection and Isolation Panel [NHANES]    Frequency of Communication with Friends and Family: More than three times a week    Frequency of Social Gatherings with Friends and Family: Never    Attends Religious Services: More than 4 times per year    Active Member  of Clubs or Organizations: No    Attends Banker Meetings: Never    Marital Status: Never married  Intimate Partner Violence: Not At Risk (04/08/2019)   Humiliation, Afraid, Rape, and Kick questionnaire    Fear of Current or Ex-Partner: No    Emotionally Abused: No    Physically Abused: No    Sexually Abused: No     Constitutional:  Denies fever, malaise, fatigue, headache, or abrupt weight changes.  HEENT: Patient reports nasal congestion and postnasal drip.  Denies eye pain, eye redness, wax buildup, ringing in the ears, runny nose or sore throat. Respiratory: Patient reports cough.  Denies difficulty breathing, shortness of breath, cough or sputum production.   Cardiovascular: Denies chest pain,  chest tightness, palpitations or swelling in the hands or feet.  Skin: Denies redness, rashes, lesions or ulcercations.  Neurological: Denies dizziness, difficulty with memory, difficulty with speech or problems with balance and coordination.    No other specific complaints in a complete review of systems (except as listed in HPI above).     Objective:   Physical Exam   BP 130/84 (BP Location: Left Arm, Patient Position: Sitting, Cuff Size: Large)   Ht 5\' 7"  (1.702 m)   Wt 260 lb 12.8 oz (118.3 kg)   LMP  (LMP Unknown)   BMI 40.85 kg/m     Wt Readings from Last 3 Encounters:  06/29/23 257 lb (116.6 kg)  06/25/23 257 lb (116.6 kg)  05/26/23 250 lb (113.4 kg)    General: Appears her stated age, obese, in NAD. Skin: Warm, dry and intact.  HEENT: Head: normal shape and size, no sinus pressure noted; Eyes: sclera white, no icterus, conjunctiva pink, PERRLA and EOMs intact; Nose: Mucosa pink and moist, turbinates swollen; Throat: + PND. Neck: No adenopathy noted. Cardiovascular: Normal rate and rhythm.  Pulmonary/Chest: Normal effort and positive vesicular breath sounds. No respiratory distress. No wheezes, rales or ronchi noted.  Neurological: Alert and oriented.    BMET    Component Value Date/Time   NA 137 05/26/2023 1736   K 3.5 05/26/2023 1736   CL 101 05/26/2023 1736   CO2 26 05/26/2023 1736   GLUCOSE 87 05/26/2023 1736   BUN 10 05/26/2023 1736   CREATININE 0.65 05/26/2023 1736   CREATININE 0.64 10/22/2021 1104   CALCIUM 9.0 05/26/2023 1736   GFRNONAA >60 05/26/2023 1736   GFRNONAA 106 07/19/2019 0846   GFRAA 123 07/19/2019 0846    Lipid Panel     Component Value Date/Time   CHOL 162 06/25/2023 0858   TRIG 121 06/25/2023 0858   HDL 49 (L) 06/25/2023 0858   CHOLHDL 3.3 06/25/2023 0858   LDLCALC 91 06/25/2023 0858    CBC    Component Value Date/Time   WBC 9.2 05/26/2023 1736   RBC 5.21 (H) 05/26/2023 1736   HGB 15.1 (H) 05/26/2023 1736   HCT 44.9  05/26/2023 1736   PLT 282 05/26/2023 1736   MCV 86.2 05/26/2023 1736   MCH 29.0 05/26/2023 1736   MCHC 33.6 05/26/2023 1736   RDW 14.0 05/26/2023 1736   LYMPHSABS 2.1 07/07/2022 2008   MONOABS 0.5 07/07/2022 2008   EOSABS 0.1 07/07/2022 2008   BASOSABS 0.0 07/07/2022 2008    Hgb A1C Lab Results  Component Value Date   HGBA1C 6.8 06/25/2023           Assessment & Plan:  Allergic rhinitis:  Continue OTC antihistamine nasal spray Recommend Claritin, Allegra or Zyrtec daily for the  next 1 to 2 weeks No indication for steroids or antibiotics at this time Will monitor  RTC in 6 months for annual exam Nicki Reaper, NP

## 2023-07-10 NOTE — Patient Instructions (Signed)

## 2023-10-14 DIAGNOSIS — Z124 Encounter for screening for malignant neoplasm of cervix: Secondary | ICD-10-CM | POA: Diagnosis not present

## 2023-10-14 DIAGNOSIS — Z01411 Encounter for gynecological examination (general) (routine) with abnormal findings: Secondary | ICD-10-CM | POA: Diagnosis not present

## 2023-10-14 DIAGNOSIS — N898 Other specified noninflammatory disorders of vagina: Secondary | ICD-10-CM | POA: Diagnosis not present

## 2023-10-14 LAB — HM PAP SMEAR

## 2023-10-14 LAB — RESULTS CONSOLE HPV: CHL HPV: POSITIVE

## 2023-11-05 DIAGNOSIS — L03012 Cellulitis of left finger: Secondary | ICD-10-CM | POA: Diagnosis not present

## 2023-12-04 DIAGNOSIS — K5904 Chronic idiopathic constipation: Secondary | ICD-10-CM | POA: Diagnosis not present

## 2023-12-04 DIAGNOSIS — Z6841 Body Mass Index (BMI) 40.0 and over, adult: Secondary | ICD-10-CM | POA: Diagnosis not present

## 2023-12-04 DIAGNOSIS — Z1211 Encounter for screening for malignant neoplasm of colon: Secondary | ICD-10-CM | POA: Diagnosis not present

## 2023-12-15 ENCOUNTER — Encounter: Payer: Self-pay | Admitting: Internal Medicine

## 2024-01-01 ENCOUNTER — Other Ambulatory Visit: Payer: Self-pay | Admitting: Internal Medicine

## 2024-01-04 NOTE — Telephone Encounter (Signed)
 Requested medication (s) are due for refill today: yes  Requested medication (s) are on the active medication list: yes  Last refill:  07/18/23  Future visit scheduled: yes  Notes to clinic:   Medication not assigned to a protocol, review manually.      Requested Prescriptions  Pending Prescriptions Disp Refills   MOUNJARO  5 MG/0.5ML Pen [Pharmacy Med Name: Mounjaro  5 MG/0.5ML Subcutaneous Solution Pen-injector] 12 mL 0    Sig: INJECT 5MG  INTO THE SKIN ONCE A WEEK     Off-Protocol Failed - 01/04/2024  8:11 AM      Failed - Medication not assigned to a protocol, review manually.      Failed - Valid encounter within last 12 months    Recent Outpatient Visits   None     Future Appointments             In 1 week Baity, Angeline ORN, NP [place], [facility]

## 2024-01-05 LAB — HEPATIC FUNCTION PANEL
ALT: 32 U/L (ref 7–35)
AST: 20 (ref 13–35)
Alkaline Phosphatase: 150 — AB (ref 25–125)

## 2024-01-05 LAB — LIPID PANEL
Cholesterol: 188 (ref 0–200)
HDL: 47 (ref 35–70)
LDL Cholesterol: 104
LDl/HDL Ratio: 4
Triglycerides: 259 — AB (ref 40–160)

## 2024-01-05 LAB — BASIC METABOLIC PANEL WITH GFR: Creatinine: 0.8 (ref 0.5–1.1)

## 2024-01-05 LAB — HEMOGLOBIN A1C: Hemoglobin A1C: 6.7

## 2024-01-05 LAB — COMPREHENSIVE METABOLIC PANEL WITH GFR: eGFR: 92

## 2024-01-06 ENCOUNTER — Ambulatory Visit: Admit: 2024-01-06 | Admitting: Internal Medicine

## 2024-01-06 HISTORY — DX: Hidradenitis suppurativa: L73.2

## 2024-01-06 HISTORY — DX: Other intervertebral disc degeneration, lumbar region without mention of lumbar back pain or lower extremity pain: M51.369

## 2024-01-06 HISTORY — DX: Excessive and frequent menstruation with regular cycle: N92.0

## 2024-01-06 HISTORY — DX: Spondylosis without myelopathy or radiculopathy, lumbosacral region: M47.817

## 2024-01-06 SURGERY — COLONOSCOPY
Anesthesia: General

## 2024-01-08 ENCOUNTER — Ambulatory Visit: Payer: Self-pay | Admitting: Internal Medicine

## 2024-01-14 ENCOUNTER — Encounter: Payer: Self-pay | Admitting: Internal Medicine

## 2024-01-14 ENCOUNTER — Ambulatory Visit (INDEPENDENT_AMBULATORY_CARE_PROVIDER_SITE_OTHER): Admitting: Internal Medicine

## 2024-01-14 VITALS — BP 128/78 | HR 73 | Ht 67.0 in | Wt 261.0 lb

## 2024-01-14 DIAGNOSIS — Z0001 Encounter for general adult medical examination with abnormal findings: Secondary | ICD-10-CM | POA: Diagnosis not present

## 2024-01-14 DIAGNOSIS — Z7985 Long-term (current) use of injectable non-insulin antidiabetic drugs: Secondary | ICD-10-CM

## 2024-01-14 DIAGNOSIS — Z23 Encounter for immunization: Secondary | ICD-10-CM

## 2024-01-14 DIAGNOSIS — Z6841 Body Mass Index (BMI) 40.0 and over, adult: Secondary | ICD-10-CM

## 2024-01-14 DIAGNOSIS — E66813 Obesity, class 3: Secondary | ICD-10-CM | POA: Diagnosis not present

## 2024-01-14 MED ORDER — AMLODIPINE-OLMESARTAN 10-40 MG PO TABS: 1.0000 | ORAL_TABLET | Freq: Every day | ORAL | 1 refills | Status: AC

## 2024-01-14 MED ORDER — ATORVASTATIN CALCIUM 10 MG PO TABS
10.0000 mg | ORAL_TABLET | Freq: Every day | ORAL | 1 refills | Status: AC
Start: 2024-01-14 — End: ?

## 2024-01-14 MED ORDER — TIRZEPATIDE 7.5 MG/0.5ML ~~LOC~~ SOAJ
7.5000 mg | SUBCUTANEOUS | 0 refills | Status: DC
Start: 2024-01-14 — End: 2024-02-05

## 2024-01-14 NOTE — Assessment & Plan Note (Signed)
 Encourage diet and exercise for weight loss

## 2024-01-14 NOTE — Patient Instructions (Signed)
 Health Maintenance for Postmenopausal Women Menopause is a normal process in which your ability to get pregnant comes to an end. This process happens slowly over many months or years, usually between the ages of 24 and 62. Menopause is complete when you have missed your menstrual period for 12 months. It is important to talk with your health care provider about some of the most common conditions that affect women after menopause (postmenopausal women). These include heart disease, cancer, and bone loss (osteoporosis). Adopting a healthy lifestyle and getting preventive care can help to promote your health and wellness. The actions you take can also lower your chances of developing some of these common conditions. What are the signs and symptoms of menopause? During menopause, you may have the following symptoms: Hot flashes. These can be moderate or severe. Night sweats. Decrease in sex drive. Mood swings. Headaches. Tiredness (fatigue). Irritability. Memory problems. Problems falling asleep or staying asleep. Talk with your health care provider about treatment options for your symptoms. Do I need hormone replacement therapy? Hormone replacement therapy is effective in treating symptoms that are caused by menopause, such as hot flashes and night sweats. Hormone replacement carries certain risks, especially as you become older. If you are thinking about using estrogen or estrogen with progestin, discuss the benefits and risks with your health care provider. How can I reduce my risk for heart disease and stroke? The risk of heart disease, heart attack, and stroke increases as you age. One of the causes may be a change in the body's hormones during menopause. This can affect how your body uses dietary fats, triglycerides, and cholesterol. Heart attack and stroke are medical emergencies. There are many things that you can do to help prevent heart disease and stroke. Watch your blood pressure High  blood pressure causes heart disease and increases the risk of stroke. This is more likely to develop in people who have high blood pressure readings or are overweight. Have your blood pressure checked: Every 3-5 years if you are 50-75 years of age. Every year if you are 77 years old or older. Eat a healthy diet  Eat a diet that includes plenty of vegetables, fruits, low-fat dairy products, and lean protein. Do not eat a lot of foods that are high in solid fats, added sugars, or sodium. Get regular exercise Get regular exercise. This is one of the most important things you can do for your health. Most adults should: Try to exercise for at least 150 minutes each week. The exercise should increase your heart rate and make you sweat (moderate-intensity exercise). Try to do strengthening exercises at least twice each week. Do these in addition to the moderate-intensity exercise. Spend less time sitting. Even light physical activity can be beneficial. Other tips Work with your health care provider to achieve or maintain a healthy weight. Do not use any products that contain nicotine or tobacco. These products include cigarettes, chewing tobacco, and vaping devices, such as e-cigarettes. If you need help quitting, ask your health care provider. Know your numbers. Ask your health care provider to check your cholesterol and your blood sugar (glucose). Continue to have your blood tested as directed by your health care provider. Do I need screening for cancer? Depending on your health history and family history, you may need to have cancer screenings at different stages of your life. This may include screening for: Breast cancer. Cervical cancer. Lung cancer. Colorectal cancer. What is my risk for osteoporosis? After menopause, you may be  at increased risk for osteoporosis. Osteoporosis is a condition in which bone destruction happens more quickly than new bone creation. To help prevent osteoporosis or  the bone fractures that can happen because of osteoporosis, you may take the following actions: If you are 61-3 years old, get at least 1,000 mg of calcium and at least 600 international units (IU) of vitamin D per day. If you are older than age 61 but younger than age 75, get at least 1,200 mg of calcium and at least 600 international units (IU) of vitamin D per day. If you are older than age 62, get at least 1,200 mg of calcium and at least 800 international units (IU) of vitamin D per day. Smoking and drinking excessive alcohol increase the risk of osteoporosis. Eat foods that are rich in calcium and vitamin D, and do weight-bearing exercises several times each week as directed by your health care provider. How does menopause affect my mental health? Depression may occur at any age, but it is more common as you become older. Common symptoms of depression include: Feeling depressed. Changes in sleep patterns. Changes in appetite or eating patterns. Feeling an overall lack of motivation or enjoyment of activities that you previously enjoyed. Frequent crying spells. Talk with your health care provider if you think that you are experiencing any of these symptoms. General instructions See your health care provider for regular wellness exams and vaccines. This may include: Scheduling regular health, dental, and eye exams. Getting and maintaining your vaccines. These include: Influenza vaccine. Get this vaccine each year before the flu season begins. Pneumonia vaccine. Shingles vaccine. Tetanus, diphtheria, and pertussis (Tdap) booster vaccine. Your health care provider may also recommend other immunizations. Tell your health care provider if you have ever been abused or do not feel safe at home. Summary Menopause is a normal process in which your ability to get pregnant comes to an end. This condition causes hot flashes, night sweats, decreased interest in sex, mood swings, headaches, or lack  of sleep. Treatment for this condition may include hormone replacement therapy. Take actions to keep yourself healthy, including exercising regularly, eating a healthy diet, watching your weight, and checking your blood pressure and blood sugar levels. Get screened for cancer and depression. Make sure that you are up to date with all your vaccines. This information is not intended to replace advice given to you by your health care provider. Make sure you discuss any questions you have with your health care provider. Document Revised: 10/29/2020 Document Reviewed: 10/29/2020 Elsevier Patient Education  2024 [employer].

## 2024-01-14 NOTE — Progress Notes (Signed)
 Done

## 2024-01-14 NOTE — Progress Notes (Signed)
 Subjective:    Patient ID: Shannon Escobar, female    DOB: 22-Aug-1975, 48 y.o.   MRN: [id number]  HPI  Patient presents the clinic today for her annual exam.  Flu: never Tetanus: 01/2020 COVID: X 2 Prevnar: Never Pap smear: 09/2023 colposcopy scheduled Mammogram: 08/2018 Colon screening: never Vision screening: annually Dentist: biannually  Diet: She does eat meat. She consumes some fruits and veggies. She does eat fried foods. She drinks mostly water, tea. Exercise: Walking  Review of Systems   Past Medical History:  Diagnosis Date   Anemia    Annular tear of lumbar disc    DJD (degenerative joint disease), lumbosacral    Hidradenitis suppurativa    Hypertension    Menorrhagia with regular cycle     Current Outpatient Medications  Medication Sig Dispense Refill   amLODipine -olmesartan  (AZOR ) 10-40 MG tablet Take 1 tablet by mouth daily. 90 tablet 1   tirzepatide  (MOUNJARO ) 5 MG/0.5ML Pen Inject 5 mg into the skin once a week. 6 mL 1   No current facility-administered medications for this visit.    Allergies  Allergen Reactions   Penicillins     Family History  Problem Relation Age of Onset   Diabetes Mother    Colon polyps Father     Social History   Socioeconomic History   Marital status: Significant Other    Spouse name: Not on file   Number of children: 2   Years of education: Not on file   Highest education level: Not on file  Occupational History   Not on file  Tobacco Use   Smoking status: Never   Smokeless tobacco: Never  Vaping Use   Vaping status: Never Used  Substance and Sexual Activity   Alcohol use: Yes    Comment: occassional in social   Drug use: Never   Sexual activity: Yes    Partners: Male    Birth control/protection: I.U.D.  Other Topics Concern   Not on file  Social History Narrative   Not on file   Social Drivers of Health   Financial Resource Strain: Low Risk  (12/04/2023)   Received from [facility]   Overall Financial Resource Strain (CARDIA)    Difficulty of Paying Living Expenses: Not very hard  Food Insecurity: No Food Insecurity (12/04/2023)   [facility] from [facility]   Hunger Vital Sign    Within the past 12 months, you worried that your food would run out before you got the money to buy more.: Never true    Within the past 12 months, the food you bought just didn't last and you didn't have money to get more.: Never true  Transportation Needs: No Transportation Needs (12/04/2023)   Received [facility] - [facility]    In the past 12 months, has lack of transportation kept you from medical appointments or from getting medications?: No    Lack of Transportation (Non-Medical): No  Physical Activity: Sufficiently Active (04/08/2019)   Exercise Vital Sign    Days of Exercise per Week: 5 days    Minutes of Exercise per Session: 30 min  Stress: No Stress Concern Present (04/08/2019)   [employer] of Occupational Health - Occupational Stress Questionnaire    Feeling of Stress : Only a little  Social Connections: Moderately Isolated (04/08/2019)   Social Connection and Isolation Panel    Frequency of Communication with Friends and Family: More than three times a  week    Frequency of Social Gatherings with Friends and Family: Never    Attends Religious Services: More than 4 times per year    Active Member of [employer] or Organizations: No    Attends Banker Meetings: Never    Marital Status: Never married  Intimate Partner Violence: Not At Risk (04/08/2019)   Humiliation, Afraid, Rape, and Kick questionnaire    Fear of Current or Ex-Partner: No    Emotionally Abused: No    Physically Abused: No    Sexually Abused: No     Constitutional: Denies fever, malaise, fatigue, headache or abrupt weight changes.  HEENT: Denies eye pain, eye redness, ear pain, ringing in the ears, wax buildup, runny nose, nasal  congestion, bloody nose, or sore throat. Respiratory: Denies difficulty breathing, shortness of breath, cough or sputum production.   Cardiovascular: Denies chest pain, chest tightness, palpitations or swelling in the hands or feet.  Gastrointestinal: Pt reports intermittent constipation. Denies abdominal pain, bloating, diarrhea or blood in the stool.  GU: Denies urgency, frequency, pain with urination, burning sensation, blood in urine, odor or discharge. Musculoskeletal: Denies decrease in range of motion, difficulty with gait, muscle pain or joint pain and swelling.  Skin: Denies redness, rashes, lesions or ulcercations.  Neurological: Denies dizziness, difficulty with memory, difficulty with speech or problems with balance and coordination.  Psych: Denies anxiety, depression, SI/HI.  No other specific complaints in a complete review of systems (except as listed in HPI above).      Objective:   Physical Exam  BP 128/78   Pulse 73   Ht 5' 7 (1.702 m)   Wt 261 lb (118.4 kg)   BMI 40.88 kg/m   Wt Readings from Last 3 Encounters:  07/10/23 260 lb 12.8 oz (118.3 kg)  06/29/23 257 lb (116.6 kg)  06/25/23 257 lb (116.6 kg)    General: Appears her stated age, obese, in NAD. Skin: Warm, dry and intact. No ulcerations noted. HEENT: Head: normal shape and size; Eyes: sclera white, no icterus, conjunctiva pink, PERRLA and EOMs intact;  Neck:  Neck supple, trachea midline. No masses, lumps or thyromegaly present.  Cardiovascular: Normal rate and rhythm. S1,S2 noted.  No murmur, rubs or gallops noted. No JVD or BLE edema.  Pulmonary/Chest: Normal effort and positive vesicular breath sounds. No respiratory distress. No wheezes, rales or ronchi noted.  Abdomen: Normal bowel sounds.  Musculoskeletal: Strength 5/5 BUE/BLE.  No difficulty with gait.  Neurological: Alert and oriented. Cranial nerves II-XII grossly intact. Coordination normal.  Psychiatric: Mood and affect normal. Behavior is  normal. Judgment and thought content normal.    BMET    Component Value Date/Time   NA 137 05/26/2023 1736   K 3.5 05/26/2023 1736   CL 101 05/26/2023 1736   CO2 26 05/26/2023 1736   GLUCOSE 87 05/26/2023 1736   BUN 10 05/26/2023 1736   CREATININE 0.65 05/26/2023 1736   CREATININE 0.64 10/22/2021 1104   CALCIUM  9.0 05/26/2023 1736   GFRNONAA >60 05/26/2023 1736   GFRNONAA 106 07/19/2019 0846   GFRAA 123 07/19/2019 0846    Lipid Panel     Component Value Date/Time   CHOL 162 06/25/2023 0858   TRIG 121 06/25/2023 0858   HDL 49 (L) 06/25/2023 0858   CHOLHDL 3.3 06/25/2023 0858   LDLCALC 91 06/25/2023 0858    CBC    Component Value Date/Time   WBC 9.2 05/26/2023 1736   RBC 5.21 (H) 05/26/2023 1736   HGB  15.1 (H) 05/26/2023 1736   HCT 44.9 05/26/2023 1736   PLT 282 05/26/2023 1736   MCV 86.2 05/26/2023 1736   MCH 29.0 05/26/2023 1736   MCHC 33.6 05/26/2023 1736   RDW 14.0 05/26/2023 1736   LYMPHSABS 2.1 07/07/2022 2008   MONOABS 0.5 07/07/2022 2008   EOSABS 0.1 07/07/2022 2008   BASOSABS 0.0 07/07/2022 2008    Hgb A1C Lab Results  Component Value Date   HGBA1C 6.8 06/25/2023            Assessment & Plan:   Preventative health maintenance:  Encouraged her to get a flu shot in the fall Tetanus UTD Encouraged her to get her COVID booster Prevnar 20 today Pap smear UTD Mammogram has already been scheduled by GYN She will call and reschedule her colonoscopy that she recently canceled Encouraged her to consume a balanced diet and exercise regimen Advised her to see an eye doctor and dentist annually Labs reviewed and abstracted into the chart  RTC in 6 months, follow-up chronic conditions Angeline Laura, NP

## 2024-01-18 DIAGNOSIS — R87612 Low grade squamous intraepithelial lesion on cytologic smear of cervix (LGSIL): Secondary | ICD-10-CM | POA: Diagnosis not present

## 2024-01-18 DIAGNOSIS — R87810 Cervical high risk human papillomavirus (HPV) DNA test positive: Secondary | ICD-10-CM | POA: Diagnosis not present

## 2024-02-05 ENCOUNTER — Telehealth: Payer: Self-pay

## 2024-02-05 MED ORDER — TIRZEPATIDE 10 MG/0.5ML ~~LOC~~ SOAJ: 10.0000 mg | SUBCUTANEOUS | 0 refills | Status: DC

## 2024-02-05 NOTE — Telephone Encounter (Signed)
 Copied from CRM [id number]. Topic: Clinical - Medication Question >> Feb 05, 2024  8:34 AM Rea ORN wrote: Reason for CRM: Pt wanted to advise PCP that she used her last 7.5 mg Mounjaro  pen today and is ready for PCP to order the next increased dosage.

## 2024-02-05 NOTE — Telephone Encounter (Signed)
 10 mg Mounjaro  sent to pharmacy

## 2024-02-05 NOTE — Addendum Note (Signed)
 Addended by: ANTONETTE ANGELINE ORN on: 02/05/2024 09:40 AM   Modules accepted: Orders

## 2024-03-22 ENCOUNTER — Telehealth: Payer: Self-pay

## 2024-03-22 MED ORDER — TIRZEPATIDE 12.5 MG/0.5ML ~~LOC~~ SOAJ: 12.5000 mg | SUBCUTANEOUS | 0 refills | Status: DC

## 2024-03-22 NOTE — Addendum Note (Signed)
 Addended by: ANTONETTE ANGELINE ORN on: 03/22/2024 02:55 PM   Modules accepted: Orders

## 2024-03-22 NOTE — Telephone Encounter (Signed)
 Patient advised.

## 2024-03-22 NOTE — Telephone Encounter (Signed)
 Copied from CRM #[id number]. Topic: Clinical - Medication Question >> Mar 22, 2024 12:51 PM Dedra B wrote: Reason for CRM: Pt calling to notify her PCP that she is ready for her to increase her Mounjaro  dosage.

## 2024-03-22 NOTE — Telephone Encounter (Signed)
 Mounjaro 12.5 mg weekly sent to pharmacy.

## 2024-04-14 DIAGNOSIS — L680 Hirsutism: Secondary | ICD-10-CM | POA: Diagnosis not present

## 2024-04-14 DIAGNOSIS — L218 Other seborrheic dermatitis: Secondary | ICD-10-CM | POA: Diagnosis not present

## 2024-05-23 ENCOUNTER — Telehealth: Payer: Self-pay

## 2024-05-23 MED ORDER — TIRZEPATIDE 15 MG/0.5ML ~~LOC~~ SOAJ: 15.0000 mg | SUBCUTANEOUS | 0 refills | Status: AC

## 2024-05-23 NOTE — Telephone Encounter (Signed)
 50 mg weekly sent to pharmacy.  She is now at max dose.

## 2024-05-23 NOTE — Addendum Note (Signed)
 Addended by: ANTONETTE ANGELINE ORN on: 05/23/2024 02:37 PM   Modules accepted: Orders

## 2024-05-23 NOTE — Telephone Encounter (Signed)
 Copied from CRM #[id number]. Topic: Clinical - Medication Question >> May 23, 2024  2:23 PM Viola F wrote: Reason for CRM: Patient wants to know if Angeline Laura will up the dosage of the tirzepatide  (MOUNJARO ) 12.5 MG/0.5ML Pen ? She would like it sent to the pharmacy on file. Please let her know via MyChart portal

## 2024-06-14 ENCOUNTER — Telehealth: Payer: Self-pay

## 2024-06-14 ENCOUNTER — Encounter: Payer: Self-pay | Admitting: Oncology

## 2024-06-14 ENCOUNTER — Other Ambulatory Visit (HOSPITAL_COMMUNITY): Payer: Self-pay

## 2024-06-14 NOTE — Telephone Encounter (Signed)
 Pharmacy Patient Advocate Encounter   Received notification from Onbase that prior authorization for Mounjaro  15 is required/requested.   Insurance verification completed.   The patient is insured through [facility].   Per test claim: Refill too soon. PA is not needed at this time. Medication was filled 05/25/24. Next eligible fill date is 06/18/24.

## 2024-06-21 ENCOUNTER — Other Ambulatory Visit (HOSPITAL_COMMUNITY): Payer: Self-pay

## 2024-07-15 ENCOUNTER — Ambulatory Visit: Admitting: Internal Medicine

## 2024-07-19 ENCOUNTER — Ambulatory Visit: Admitting: Internal Medicine

## 2025-01-16 ENCOUNTER — Encounter: Admitting: Internal Medicine
